# Patient Record
Sex: Female | Born: 1990 | Race: White | Hispanic: No | Marital: Married | Smoking: Never smoker
Health system: Southern US, Community
[De-identification: ages and names within clinical notes are randomized; demographics above are authoritative.]

## PROBLEM LIST (undated history)

## (undated) DIAGNOSIS — J329 Chronic sinusitis, unspecified: Secondary | ICD-10-CM

## (undated) DIAGNOSIS — J302 Other seasonal allergic rhinitis: Secondary | ICD-10-CM

---
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
## 2009-02-10 ENCOUNTER — Emergency Department (HOSPITAL_COMMUNITY): Admission: EM | Admit: 2009-02-10 | Discharge: 2009-02-11 | Payer: Self-pay | Admitting: Emergency Medicine

## 2011-04-02 IMAGING — CT CT CERVICAL SPINE W/O CM
4 of 5 series · 16 of 33 positions shown, 18 images · non-contrast
Comparison: None

CT HEAD

CLINICAL DATA: Trauma

CT HEAD WITHOUT CONTRAST
CT MAXILLOFACIAL WITHOUT CONTRAST
CT CERVICAL SPINE WITHOUT CONTRAST
TECHNIQUE: Multidetector CT imaging of the head, cervical spine,
and maxillofacial structures were performed using the standard
protocol without intravenous contrast. Multiplanar CT image
reconstructions of the cervical spine and maxillofacial structures
were also generated.

[Series 4: c_spine 2.0 b31s · axial · 0.23mm/px · z∈[-254,-170]mm · 4 of 70 slices shown, 5 images]
[im 14/70  soft-tissue]
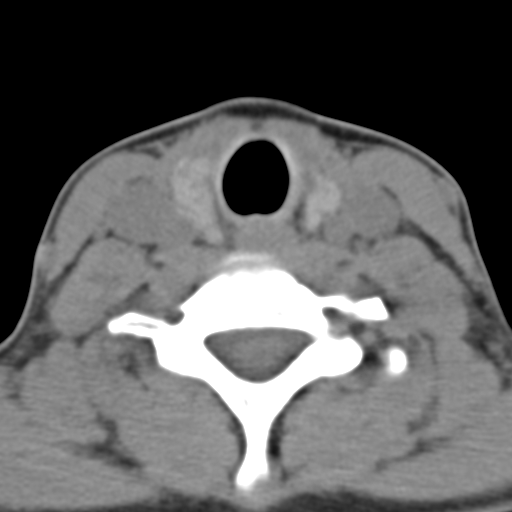
[im 14/70  bone]
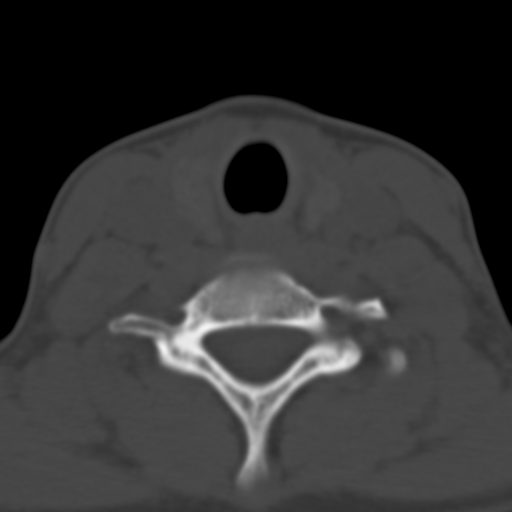
[im 28/70  bone]
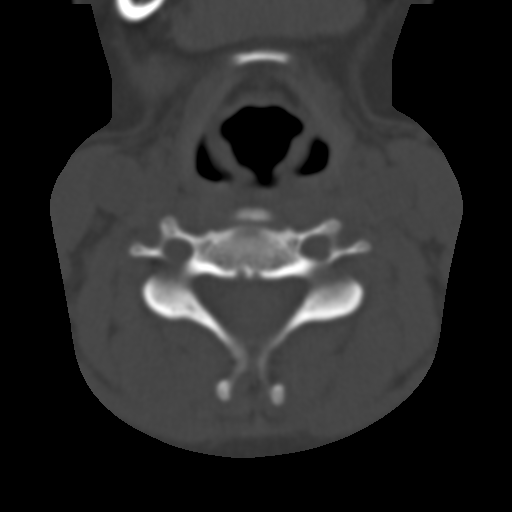
[im 42/70  bone]
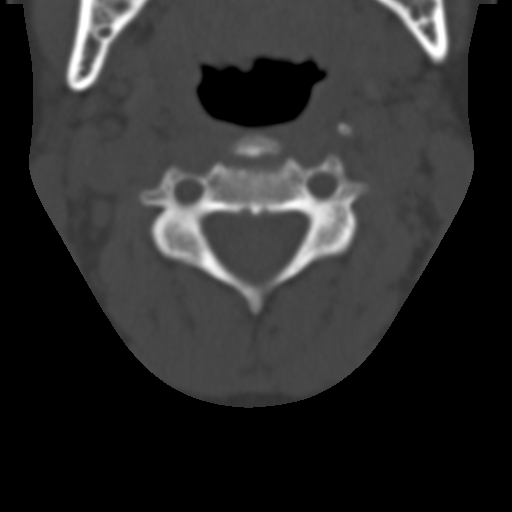
[im 56/70  bone]
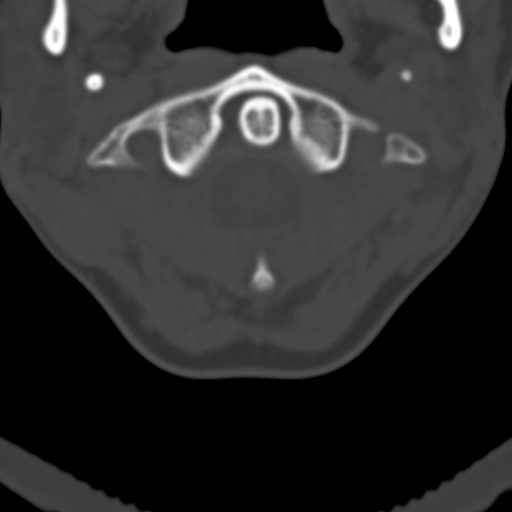

[Series 602: axial cervical · axial · 0.27mm/px · z∈[-297,-222]mm · 4 of 78 slices shown]
[im 16/78  bone]
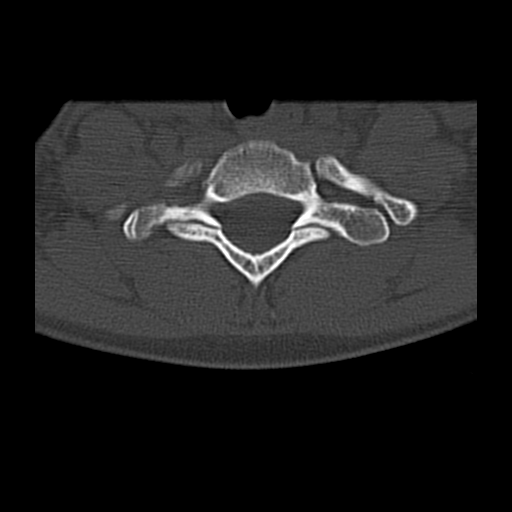
[im 31/78  bone]
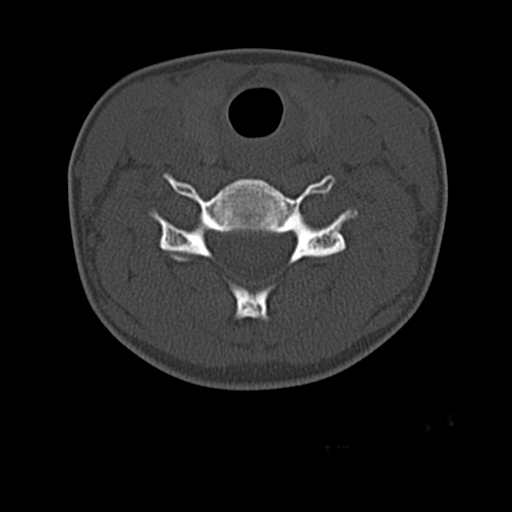
[im 47/78  bone]
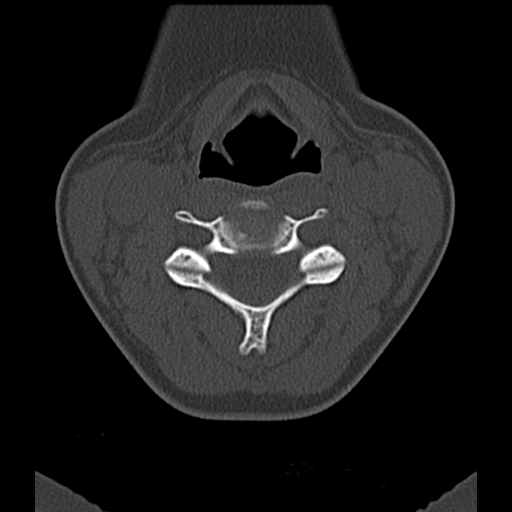
[im 62/78  bone]
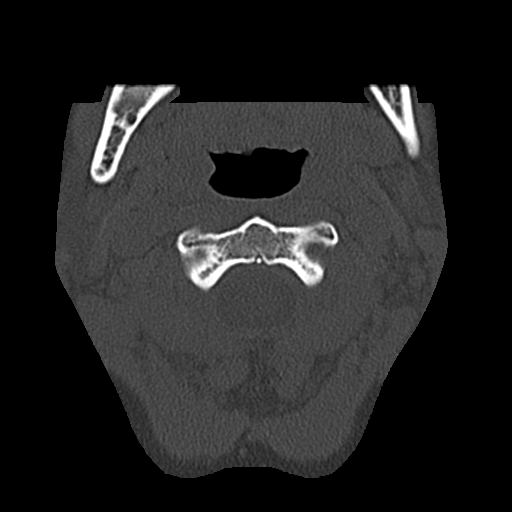

[Series 603: coronal cervical · coronal · 0.27mm/px · 3 of 28 slices shown]
[im 6/28  bone]
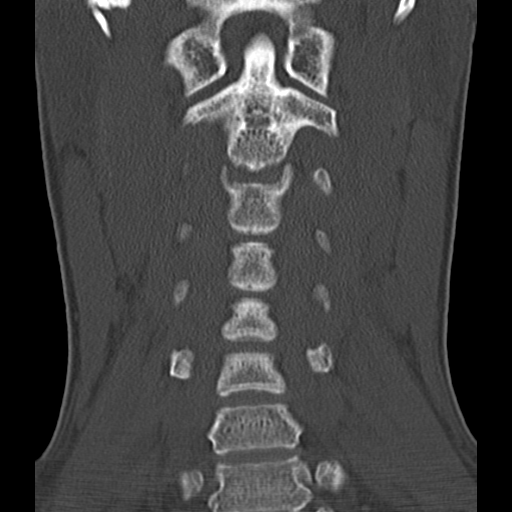
[im 11/28  bone]
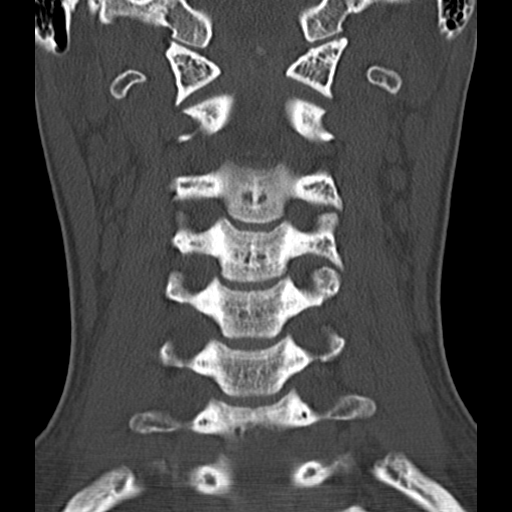
[im 17/28  bone]
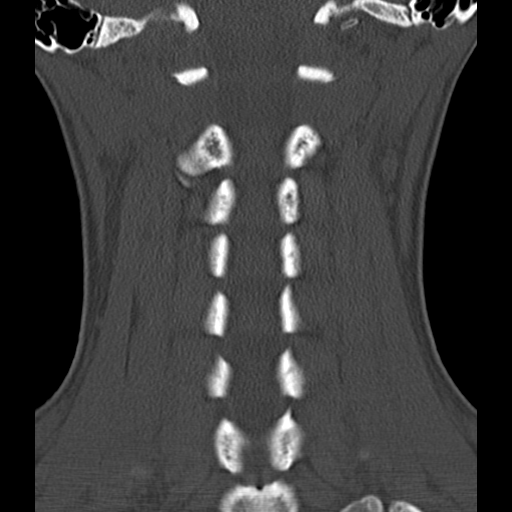

[Series 604: sagittal cervical · sagittal · 0.27mm/px · 5 of 28 slices shown, 6 images]
[im 10/28  bone]
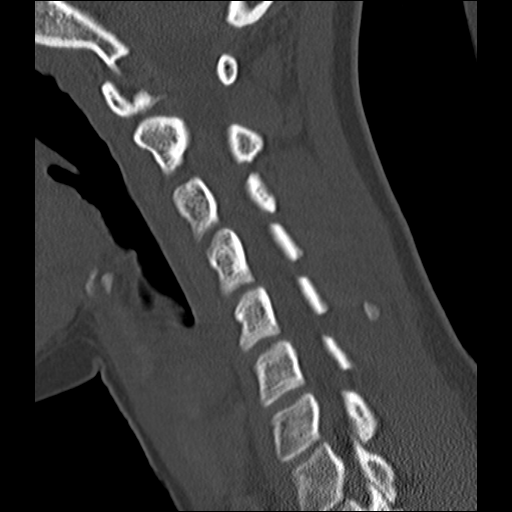
[im 12/28  bone]
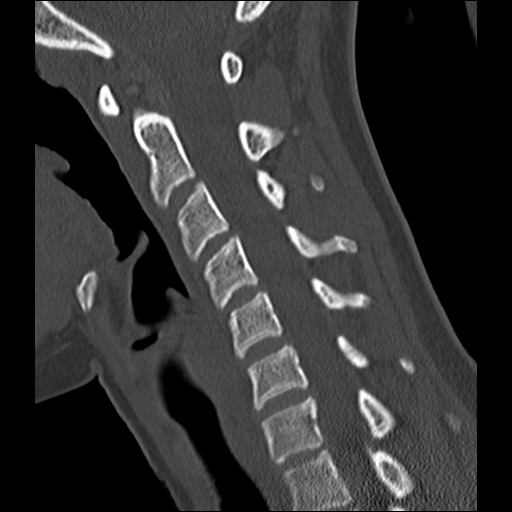
[im 14/28  soft-tissue]
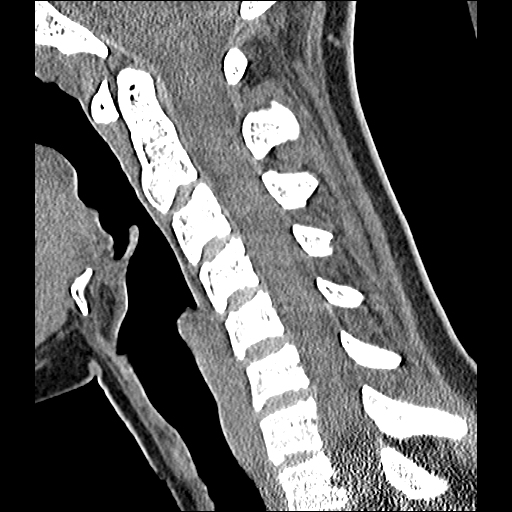
[im 14/28  bone]
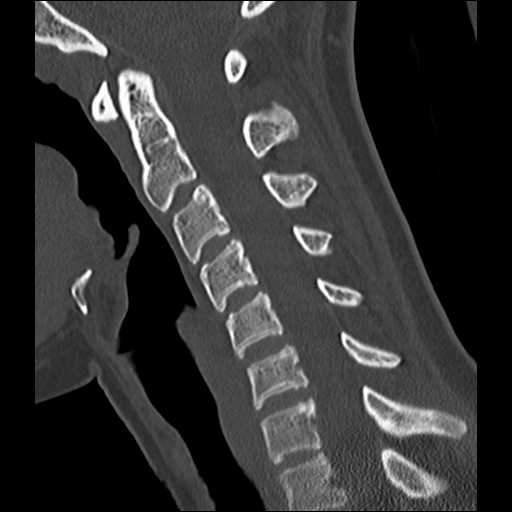
[im 16/28  bone]
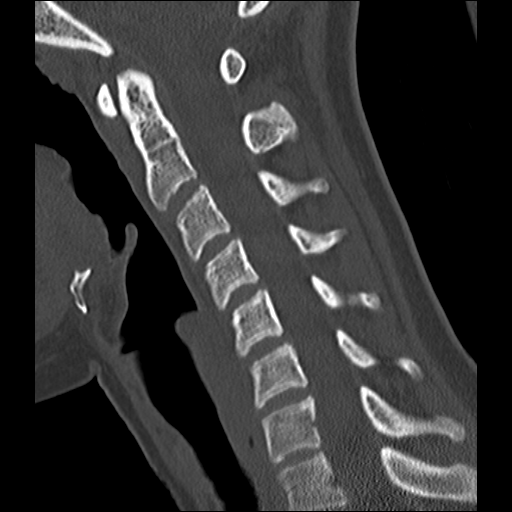
[im 19/28  bone]
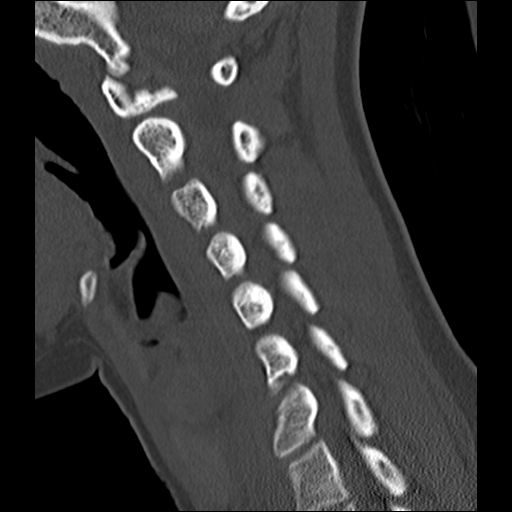

[16 of 33 positions shown; findings below may reference images not displayed]

FINDINGS: The brain has a normal appearance without evidence for
hemorrhage, infarction, hydrocephalus, or mass lesion.  There is no
extra axial fluid collection.  The skull and paranasal sinuses are
normal.
IMPRESSION: No acute intracranial abnormalities.

CT MAXILLOFACIAL
FINDINGS: The paranasal sinuses are all well aerated.  No air-
fluid levels identified.  There is no evidence for an orbital
blowout fracture.

No other focal bone abnormalities identified.  The nasal septum is
midline and the nasal bone is intact.
IMPRESSION: 1.  No evidence for the facial bone fracture.

CT CERVICAL SPINE
FINDINGS: There is no evidence of cervical spine fracture.
Alignment is normal.  Intervertebral disc spaces are maintained.
IMPRESSION: 1.  No acute findings.

## 2013-05-14 ENCOUNTER — Emergency Department
Admission: EM | Admit: 2013-05-14 | Discharge: 2013-05-14 | Disposition: A | Discharge status: Home / Self Care | Payer: 59 | Source: Home / Self Care | Attending: Family Medicine | Admitting: Family Medicine

## 2013-05-14 ENCOUNTER — Encounter: Payer: Self-pay | Admitting: Emergency Medicine

## 2013-05-14 DIAGNOSIS — J329 Chronic sinusitis, unspecified: Secondary | ICD-10-CM

## 2013-05-14 DIAGNOSIS — J0100 Acute maxillary sinusitis, unspecified: Secondary | ICD-10-CM

## 2013-05-14 DIAGNOSIS — H547 Unspecified visual loss: Secondary | ICD-10-CM

## 2013-05-14 DIAGNOSIS — J0110 Acute frontal sinusitis, unspecified: Secondary | ICD-10-CM

## 2013-05-14 HISTORY — DX: Other seasonal allergic rhinitis: J30.2

## 2013-05-14 HISTORY — DX: Chronic sinusitis, unspecified: J32.9

## 2013-05-14 LAB — POCT FASTING CBG KUC MANUAL ENTRY: POCT GLUCOSE (MANUAL ENTRY) KUC: 247 mg/dL — AB (ref 70–99)

## 2013-05-14 MED ORDER — AZITHROMYCIN 250 MG PO TABS
ORAL_TABLET | ORAL | Status: DC
Start: 2013-05-14 — End: 2013-12-05

## 2013-05-14 MED ORDER — PREDNISONE 20 MG PO TABS: 20.0000 mg | ORAL_TABLET | Freq: Two times a day (BID) | ORAL | Status: DC

## 2013-05-14 NOTE — Discharge Instructions (Signed)
Take Mucinex D (1200mg  guaifenesin with decongestant) twice daily for congestion.  Increase fluid intake, rest. May use Afrin nasal spray (or generic oxymetazoline) once daily for about 5 days.  Also recommend using saline nasal spray several times daily and saline nasal irrigation (AYR is a common brand).  Use Nasonex spray after Afrin and saline irrigation. Stop all antihistamines for now, and other non-prescription cough/cold preparations. May take Tylenol as needed for pain.    Sinusitis Sinusitis is redness, soreness, and swelling (inflammation) of the paranasal sinuses. Paranasal sinuses are air pockets within the bones of your face (beneath the eyes, the middle of the forehead, or above the eyes). In healthy paranasal sinuses, mucus is able to drain out, and air is able to circulate through them by way of your nose. However, when your paranasal sinuses are inflamed, mucus and air can become trapped. This can allow bacteria and other germs to grow and cause infection. Sinusitis can develop quickly and last only a short time (acute) or continue over a long period (chronic). Sinusitis that lasts for more than 12 weeks is considered chronic.  CAUSES  Causes of sinusitis include:  Allergies.  Structural abnormalities, such as displacement of the cartilage that separates your nostrils (deviated septum), which can decrease the air flow through your nose and sinuses and affect sinus drainage.  Functional abnormalities, such as when the small hairs (cilia) that line your sinuses and help remove mucus do not work properly or are not present. SYMPTOMS  Symptoms of acute and chronic sinusitis are the same. The primary symptoms are pain and pressure around the affected sinuses. Other symptoms include:  Upper toothache.  Earache.  Headache.  Bad breath.  Decreased sense of smell and taste.  A cough, which worsens when you are lying flat.  Fatigue.  Fever.  Thick drainage from your nose,  which often is green and may contain pus (purulent).  Swelling and warmth over the affected sinuses. DIAGNOSIS  Your caregiver will perform a physical exam. During the exam, your caregiver may:  Look in your nose for signs of abnormal growths in your nostrils (nasal polyps).  Tap over the affected sinus to check for signs of infection.  View the inside of your sinuses (endoscopy) with a special imaging device with a light attached (endoscope), which is inserted into your sinuses. If your caregiver suspects that you have chronic sinusitis, one or more of the following tests may be recommended:  Allergy tests.  Nasal culture A sample of mucus is taken from your nose and sent to a lab and screened for bacteria.  Nasal cytology A sample of mucus is taken from your nose and examined by your caregiver to determine if your sinusitis is related to an allergy. TREATMENT  Most cases of acute sinusitis are related to a viral infection and will resolve on their own within 10 days. Sometimes medicines are prescribed to help relieve symptoms (pain medicine, decongestants, nasal steroid sprays, or saline sprays).  However, for sinusitis related to a bacterial infection, your caregiver will prescribe antibiotic medicines. These are medicines that will help kill the bacteria causing the infection.  Rarely, sinusitis is caused by a fungal infection. In theses cases, your caregiver will prescribe antifungal medicine. For some cases of chronic sinusitis, surgery is needed. Generally, these are cases in which sinusitis recurs more than 3 times per year, despite other treatments. HOME CARE INSTRUCTIONS   Drink plenty of water. Water helps thin the mucus so your sinuses can drain  more easily.  Use a humidifier.  Inhale steam 3 to 4 times a day (for example, sit in the bathroom with the shower running).  Apply a warm, moist washcloth to your face 3 to 4 times a day, or as directed by your caregiver.  Use  saline nasal sprays to help moisten and clean your sinuses.  Take over-the-counter or prescription medicines for pain, discomfort, or fever only as directed by your caregiver. SEEK IMMEDIATE MEDICAL CARE IF:  You have increasing pain or severe headaches.  You have nausea, vomiting, or drowsiness.  You have swelling around your face.  You have vision problems.  You have a stiff neck.  You have difficulty breathing. MAKE SURE YOU:   Understand these instructions.  Will watch your condition.  Will get help right away if you are not doing well or get worse. Document Released: 04/28/2005 Document Revised: 07/21/2011 Document Reviewed: 05/13/2011 [facility] LPExitCare Patient Information 2014 [employer], [place].

## 2013-05-14 NOTE — ED Provider Notes (Signed)
CSN: [id number]     Arrival date & time 05/14/13  1421 History   First MD Initiated Contact with Patient 05/14/13 1515     Chief Complaint  Patient presents with  . Facial Pain      HPI Comments: Patient reports that she was treated for influenza 1.5 weeks ago with Tamiflu and improved, but has now developed facial pressure and persistent sinus congestion.  No fevers, chills, and sweats.  She has a history of seasonal allergies and sinusitis.  The history is provided by the patient.    Past Medical History  Diagnosis Date  . Recurrent sinus infections   . Seasonal allergies    History reviewed. No pertinent past surgical history. Family History  Problem Relation Age of Onset  . Hypertension Father    History  Substance Use Topics  . Smoking status: Never Smoker   . Smokeless tobacco: Not on file  . Alcohol Use: No   OB History   Grav Para Term Preterm Abortions TAB SAB Ect Mult Living                 Review of Systems No sore throat No cough No pleuritic pain No wheezing + nasal congestion + post-nasal drainage + sinus pain/pressure No itchy/red eyes No earache No hemoptysis No SOB No fever/chills No nausea No vomiting No abdominal pain No diarrhea No urinary symptoms No skin rash No fatigue No myalgias + headache Used OTC meds without relief  Allergies  Penicillins  Home Medications   Current Outpatient Rx  Name  Route  Sig  Dispense  Refill  . drospirenone-ethinyl estradiol (YAZ,GIANVI,LORYNA) 3-0.02 MG tablet   Oral   Take 1 tablet by mouth daily.         Marland Kitchen. azithromycin (ZITHROMAX Z-PAK) 250 MG tablet      Take 2 tabs today; then begin one tab once daily for 4 more days.   6 each   0   . predniSONE (DELTASONE) 20 MG tablet   Oral   Take 1 tablet (20 mg total) by mouth 2 (two) times daily. Take with food.   10 tablet   0    BP 99/67  Pulse 109  Temp(Src) 97.5 F (36.4 C) (Oral)  Resp 16  Ht 5\' 6"  (1.676 m)  Wt 125 lb (56.7 kg)   BMI 20.19 kg/m2  SpO2 97%  LMP 04/23/2013 Physical Exam Nursing notes and Vital Signs reviewed. Appearance:  Patient appears healthy, stated age, and in no acute distress Eyes:  Pupils are equal, round, and reactive to light and accomodation.  Extraocular movement is intact.  Conjunctivae are not inflamed  Ears:  Canals normal.  Tympanic membranes normal.  Nose:  Congested turbinates.   Maxillary and frontal sinus tenderness is present.  Pharynx:  Normal Neck:  Supple.  No adenopathy Lungs:  Clear to auscultation.  Breath sounds are equal.  Heart:  Regular rate and rhythm without murmurs, rubs, or gallops.  Abdomen:  Nontender without masses or hepatosplenomegaly.  Bowel sounds are present.  No CVA or flank tenderness.  Skin:  No rash present.   ED Course  Procedures  none      MDM   1. Sinusitis, chronic   2. Acute maxillary sinusitis   3. Acute frontal sinusitis    Begin prednisone burst and Z-pack. Take Mucinex D (1200mg  guaifenesin with decongestant) twice daily for congestion.  Increase fluid intake, rest. May use Afrin nasal spray (or generic oxymetazoline) once daily for about  5 days.  Also recommend using saline nasal spray several times daily and saline nasal irrigation (AYR is a common brand).  Use Nasonex spray after Afrin and saline irrigation. Stop all antihistamines for now, and other non-prescription cough/cold preparations. May take Tylenol as needed for pain. Followup with ENT if not improving    [name], MD 05/15/13 2139

## 2013-05-14 NOTE — ED Notes (Signed)
Reports being treated for influenza 1 1/2 weeks ago with Tamiflu; now feels she has a sinus infection (history of these). No OTCs today, but has tried mucinex.

## 2013-10-16 ENCOUNTER — Encounter: Payer: Self-pay | Admitting: Emergency Medicine

## 2013-10-16 ENCOUNTER — Emergency Department
Admission: EM | Admit: 2013-10-16 | Discharge: 2013-10-16 | Disposition: A | Discharge status: Home / Self Care | Payer: 59 | Source: Home / Self Care | Attending: Emergency Medicine | Admitting: Emergency Medicine

## 2013-10-16 DIAGNOSIS — J069 Acute upper respiratory infection, unspecified: Secondary | ICD-10-CM

## 2013-10-16 MED ORDER — PREDNISONE (PAK) 10 MG PO TABS: ORAL_TABLET | Freq: Every day | ORAL | Status: DC

## 2013-10-16 MED ORDER — AZITHROMYCIN 250 MG PO TABS: ORAL_TABLET | ORAL | Status: DC

## 2013-10-16 NOTE — ED Notes (Signed)
Pt complains of sinus congestion and pressure, ear fullness, sweats, chills, nasal drainage green.

## 2013-10-16 NOTE — ED Provider Notes (Signed)
CSN: [id number]     Arrival date & time 10/16/13  1134 History   First MD Initiated Contact with Patient 10/16/13 1141     Chief Complaint  Patient presents with  . Sinusitis   (Consider location/radiation/quality/duration/timing/severity/associated sxs/prior Treatment) HPI April Frost is a 23 y.o. female who complains of onset of cold symptoms for a few days.  The symptoms are constant and mild-moderate in severity.  She has a history of allergies and has seen an ENT in the past.  She gets these symptoms about every 3 months but also has chronic seasonal allergies.  Last antibiotics that she was on was about 6 months ago and at that time she also needed some prednisone. No sore throat +/- cough No pleuritic pain No wheezing + nasal congestion + post-nasal drainage + sinus pain/pressure No chest congestion No itchy/red eyes No earache No hemoptysis No SOB No chills/sweats No fever No nausea No vomiting No abdominal pain No diarrhea No skin rashes No fatigue No myalgias + headache     Past Medical History  Diagnosis Date  . Recurrent sinus infections   . Seasonal allergies    History reviewed. No pertinent past surgical history. Family History  Problem Relation Age of Onset  . Hypertension Father    History  Substance Use Topics  . Smoking status: Never Smoker   . Smokeless tobacco: Not on file  . Alcohol Use: No   OB History   Grav Para Term Preterm Abortions TAB SAB Ect Mult Living                 Review of Systems  All other systems reviewed and are negative.   Allergies  Penicillins  Home Medications   Prior to Admission medications   Medication Sig Start Date End Date Taking? Authorizing Provider  azithromycin (ZITHROMAX Z-PAK) 250 MG tablet Take 2 tabs today; then begin one tab once daily for 4 more days. 05/14/13   Lattie HawStephen A Beese, MD  azithromycin (ZITHROMAX Z-PAK) 250 MG tablet Use as directed 10/16/13   Marlaine HindJeffrey H Henderson, MD  drospirenone-ethinyl  estradiol Pierre Bali(YAZ,GIANVI,LORYNA) 3-0.02 MG tablet Take 1 tablet by mouth daily.    Historical Provider, MD  predniSONE (DELTASONE) 20 MG tablet Take 1 tablet (20 mg total) by mouth 2 (two) times daily. Take with food. 05/14/13   Lattie HawStephen A Beese, MD  predniSONE (STERAPRED UNI-PAK) 10 MG tablet Take by mouth daily. 6 day pack, use as directed 10/16/13   Marlaine HindJeffrey H Henderson, MD   BP 105/72  Pulse 127  Temp(Src) 97.9 F (36.6 C) (Oral)  Ht 5\' 6"  (1.676 m)  Wt 120 lb 8 oz (54.658 kg)  BMI 19.46 kg/m2  SpO2 97% Physical Exam  Nursing note and vitals reviewed. Constitutional: She is oriented to person, place, and time. She appears well-developed and well-nourished.  HENT:  Head: Normocephalic and atraumatic.  Right Ear: Tympanic membrane, external ear and ear canal normal.  Left Ear: Tympanic membrane, external ear and ear canal normal.  Nose: Mucosal edema and rhinorrhea present. Right sinus exhibits maxillary sinus tenderness. Left sinus exhibits maxillary sinus tenderness.  Mouth/Throat: No oropharyngeal exudate, posterior oropharyngeal edema or posterior oropharyngeal erythema.  Eyes: No scleral icterus.  Neck: Neck supple.  Cardiovascular: Regular rhythm and normal heart sounds.   Pulmonary/Chest: Effort normal and breath sounds normal. No respiratory distress.  Neurological: She is alert and oriented to person, place, and time.  Skin: Skin is warm and dry.  Psychiatric: She has a normal mood  and affect. Her speech is normal.    ED Course  Procedures (including critical care time) Labs Review Labs Reviewed - No data to display  Imaging Review No results found.   MDM   1. Acute upper respiratory infections of unspecified site    1)  Take the prescribed antibiotic as instructed.  Take the prednisone first.  Educated her on allergies and treatment.   2)  Use nasal saline solution (over the counter) at least 3 times a day. 3)  Use over the counter decongestants like Zyrtec-D every 12  hours as needed to help with congestion.  If you have hypertension, do not take medicines with sudafed.  4)  Can take tylenol every 6 hours or motrin every 8 hours for pain or fever. 5)  Follow up with your primary doctor if no improvement in 5-7 days, sooner if increasing pain, fever, or new symptoms.     Marlaine Hind, MD 10/16/13 [phone]

## 2013-12-05 ENCOUNTER — Encounter: Payer: Self-pay | Admitting: Physician Assistant

## 2013-12-05 ENCOUNTER — Ambulatory Visit (INDEPENDENT_AMBULATORY_CARE_PROVIDER_SITE_OTHER): Payer: 59 | Admitting: Physician Assistant

## 2013-12-05 VITALS — BP 130/86 | HR 77 | Ht 66.0 in | Wt 123.0 lb

## 2013-12-05 DIAGNOSIS — R55 Syncope and collapse: Secondary | ICD-10-CM

## 2013-12-05 DIAGNOSIS — Z13220 Encounter for screening for lipoid disorders: Secondary | ICD-10-CM

## 2013-12-05 DIAGNOSIS — Z131 Encounter for screening for diabetes mellitus: Secondary | ICD-10-CM

## 2013-12-06 NOTE — Progress Notes (Signed)
Subjective:    Patient ID: April Frost, female    DOB: 11/30/1990, 23 y.o.   MRN: [id number]  HPI Patient is a 23 year old female who presents to the clinic to establish care.  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 She reports that this problem has been going On when she was a very young child. She has had workups of blood And also EKG as well but have always been normal. She's never been sent to a specialist. She has never had any imaging. Her last episode of passing out was November 2014 wash your struggling with a virus. Currently nothing is being done about her passing out. She feels when she is going to pass out and gets to the ground. Never hit her head. She comes back to in seconds.   .. Family History  Problem Relation Age of Onset  . Hypertension Father   . Hyperlipidemia Father   . Heart attack Paternal Grandfather    .Marland Kitchen. History   Social History  . Marital Status: Single    Spouse Name: N/A    Number of Children: N/A  . Years of Education: N/A   Occupational History  . Not on file.   Social History Main Topics  . Smoking status: Never Smoker   . Smokeless tobacco: Not on file  . Alcohol Use: No  . Drug Use: No  . Sexual Activity: Yes   Other Topics Concern  . Not on file   Social History Narrative  . No narrative on file   Patient is currently on birth control and her last Pap smear was 01/2013 that her OB/GYN. Her OB/GYN didn't prescribe her birth control.  She would like to make sure she is overall healthy today and establish care. Last winter she was in urgent care a lot do to virus and bacterial infections. 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 She wants to know of any recommendations for how to get immune system up.   Review of Systems  All other systems reviewed and are negative.      Objective:   Physical Exam  Constitutional: She is oriented to person, place, and  time. She appears well-developed and well-nourished.  HENT:  Head: Normocephalic and atraumatic.  Neck: Normal range of motion. Neck supple. No thyromegaly present.  Cardiovascular: Normal rate, regular rhythm and normal heart sounds.   Pulmonary/Chest: Effort normal and breath sounds normal.  Neurological: She is alert and oriented to person, place, and time.  Skin: Skin is dry.  Psychiatric: She has a normal mood and affect. Her behavior is normal.          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 It does sound like when she has passing out episodes could be do to dehydration, vasovagal response or when she is sick. I will reviewed records when I get them. Suggested that pt think about referral to neurology to further evaluation. She will consider but does not seemed bothered with it at this time.   We did discuss ways to keep immune system healthy. Handwashing as well as vitamin C and think were encouraged. Also encouraged good sleep of at least 8 hours at night. Pt aware there are no studies about essential oil but cannot hurt to use based on symptoms. Start mucinex early when feeling congested.  She did see ENT for her multiple cases of strep  throat that they did not want to take her tonsils so she had more episodes. Come see [place] when not feel well.   Patient is unaware of her last tetanus shot. She would like to wait to get records from her pediatrics office before we give tetanus shot. She does feel like she had it going into college.  OB/GYN manages her birth control. Encouraged her to have STD testing if sexually active especially using condoms. Encouraged use of condoms.

## 2014-09-18 ENCOUNTER — Emergency Department
Admission: EM | Admit: 2014-09-18 | Discharge: 2014-09-18 | Disposition: A | Discharge status: Home / Self Care | Payer: BLUE CROSS/BLUE SHIELD | Source: Home / Self Care | Attending: Family Medicine | Admitting: Family Medicine

## 2014-09-18 ENCOUNTER — Ambulatory Visit: Payer: Self-pay | Admitting: Physician Assistant

## 2014-09-18 ENCOUNTER — Encounter: Payer: Self-pay | Admitting: Emergency Medicine

## 2014-09-18 DIAGNOSIS — J309 Allergic rhinitis, unspecified: Secondary | ICD-10-CM

## 2014-09-18 DIAGNOSIS — J3089 Other allergic rhinitis: Secondary | ICD-10-CM

## 2014-09-18 DIAGNOSIS — J069 Acute upper respiratory infection, unspecified: Secondary | ICD-10-CM | POA: Diagnosis not present

## 2014-09-18 MED ORDER — BECLOMETHASONE DIPROPIONATE 80 MCG/ACT NA AERS: INHALATION_SPRAY | NASAL | Status: DC

## 2014-09-18 MED ORDER — AZITHROMYCIN 250 MG PO TABS: ORAL_TABLET | ORAL | Status: DC

## 2014-09-18 MED ORDER — BENZONATATE 200 MG PO CAPS: 200.0000 mg | ORAL_CAPSULE | Freq: Every day | ORAL | Status: DC

## 2014-09-18 MED ORDER — PREDNISONE 20 MG PO TABS: 20.0000 mg | ORAL_TABLET | Freq: Two times a day (BID) | ORAL | Status: DC

## 2014-09-18 NOTE — Discharge Instructions (Signed)
Continue Mucinex D twice daily, with plenty of water, for cough and congestion.    May use Afrin nasal spray (or generic oxymetazoline) once daily for about 5 days.  Also recommend using saline nasal spray several times daily and saline nasal irrigation (AYR is a common brand).  Use Qnasl nasal spray each morning after using Afrin nasal spray and saline nasal irrigation.  Attempt to taper off Afrin spray. Try warm salt water gargles for sore throat.  Stop all antihistamines for now, and other non-prescription cough/cold preparations. Begin benzonatate (Tessalon) at bedtime if cough develops   Follow-up with family doctor if not improving about10 days.

## 2014-09-18 NOTE — ED Notes (Signed)
Reports congestion x 3 days; thought it was seasonal allergies but has not resumed her Allegra.

## 2014-09-18 NOTE — ED Provider Notes (Signed)
CSN: [id number]     Arrival date & time 09/18/14  1610 History   First MD Initiated Contact with Patient 09/18/14 1648     Chief Complaint  Patient presents with  . Nasal Congestion      HPI Comments: Patient complains of two day history of typical cold-like symptoms including mild sore throat, increased sinus congestion, fatigue, and chills.  No cough.  She has seasonal allergies and thought that she was having a flare-up.  Past history of asthma as a child.   The history is provided by the patient.    Past Medical History  Diagnosis Date  . Recurrent sinus infections   . Seasonal allergies    History reviewed. No pertinent past surgical history. Family History  Problem Relation Age of Onset  . Hypertension Father   . Hyperlipidemia Father   . Heart attack Paternal Grandfather    History  Substance Use Topics  . Smoking status: Never Smoker   . Smokeless tobacco: Not on file  . Alcohol Use: No   OB History    No data available     Review of Systems + sore throat No cough No pleuritic pain No wheezing + nasal congestion + post-nasal drainage + sinus pain/pressure No itchy/red eyes No earache No hemoptysis No SOB No fever, + chills + nausea No vomiting No abdominal pain No diarrhea No urinary symptoms No skin rash + fatigue No myalgias No headache Used OTC meds without relief  Allergies  Penicillins  Home Medications   Prior to Admission medications   Medication Sig Start Date End Date Taking? Authorizing Provider  azithromycin (ZITHROMAX Z-PAK) 250 MG tablet Take 2 tabs today; then begin one tab once daily for 4 more days. 09/18/14   Lattie HawStephen A Beese, MD  Beclomethasone Dipropionate (QNASL) 80 MCG/ACT AERS Place 2 sprays in each nostril once daily 09/18/14   Lattie HawStephen A Beese, MD  benzonatate (TESSALON) 200 MG capsule Take 1 capsule (200 mg total) by mouth at bedtime. Take as needed for cough 09/18/14   Lattie HawStephen A Beese, MD  drospirenone-ethinyl estradiol  (YAZ,GIANVI,LORYNA) 3-0.02 MG tablet Take 1 tablet by mouth daily.    Historical Provider, MD  predniSONE (DELTASONE) 20 MG tablet Take 1 tablet (20 mg total) by mouth 2 (two) times daily. Take with food. 09/18/14   Lattie HawStephen A Beese, MD   BP 108/71 mmHg  Pulse 86  Temp(Src) 98 F (36.7 C) (Oral)  Resp 16  Ht 5\' 6"  (1.676 m)  Wt 125 lb (56.7 kg)  BMI 20.19 kg/m2  SpO2 100%  LMP 09/07/2014 Physical Exam Nursing notes and Vital Signs reviewed. Appearance:  Patient appears stated age, and in no acute distress Eyes:  Pupils are equal, round, and reactive to light and accomodation.  Extraocular movement is intact.  Conjunctivae are not inflamed  Ears:  Canals normal.  Tympanic membranes normal.  Nose: Congested turbinates.  Maxillary sinus tenderness is present.  Pharynx:  Normal Neck:  Supple.   Nontender posterior nodes are palpated bilaterally  Lungs:  Clear to auscultation.  Breath sounds are equal.  Heart:  Regular rate and rhythm without murmurs, rubs, or gallops.  Abdomen:  Nontender without masses or hepatosplenomegaly.  Bowel sounds are present.  No CVA or flank tenderness.  Extremities:  No edema.  No calf tenderness Skin:  No rash present.   ED Course  Procedures  none   MDM   1. Viral URI; early symptoms   2. Perennial allergic rhinitis  Begin trial of Qnasl nasal spray.  Begin prednisone burst and Z-pack Continue Mucinex D twice daily, with plenty of water, for cough and congestion.    May use Afrin nasal spray (or generic oxymetazoline) once daily for about 5 days.  Also recommend using saline nasal spray several times daily and saline nasal irrigation (AYR is a common brand).  Use Qnasl nasal spray each morning after using Afrin nasal spray and saline nasal irrigation.  Attempt to taper off Afrin spray. Try warm salt water gargles for sore throat.  Stop all antihistamines for now, and other non-prescription cough/cold preparations. Begin benzonatate (Tessalon) at  bedtime if cough develops   Follow-up with family doctor if not improving about10 days.     Lattie HawStephen A Beese, MD 09/29/14 [phone]

## 2015-05-03 ENCOUNTER — Emergency Department (INDEPENDENT_AMBULATORY_CARE_PROVIDER_SITE_OTHER)
Admission: EM | Admit: 2015-05-03 | Discharge: 2015-05-03 | Disposition: A | Discharge status: Home / Self Care | Payer: BLUE CROSS/BLUE SHIELD | Source: Home / Self Care | Attending: Family Medicine | Admitting: Family Medicine

## 2015-05-03 DIAGNOSIS — R05 Cough: Secondary | ICD-10-CM

## 2015-05-03 DIAGNOSIS — R059 Cough, unspecified: Secondary | ICD-10-CM

## 2015-05-03 DIAGNOSIS — J029 Acute pharyngitis, unspecified: Secondary | ICD-10-CM

## 2015-05-03 LAB — POCT RAPID STREP A (OFFICE): Rapid Strep A Screen: NEGATIVE

## 2015-05-03 MED ORDER — BENZONATATE 100 MG PO CAPS: 100.0000 mg | ORAL_CAPSULE | Freq: Three times a day (TID) | ORAL | Status: DC

## 2015-05-03 MED ORDER — MAGIC MOUTHWASH W/LIDOCAINE: 5.0000 mL | Freq: Three times a day (TID) | ORAL | Status: DC | PRN

## 2015-05-03 NOTE — ED Provider Notes (Signed)
CSN: [id number]     Arrival date & time 05/03/15  1629 History   First MD Initiated Contact with Patient 05/03/15 1649     Chief Complaint  Patient presents with  . Sore Throat   (Consider location/radiation/quality/duration/timing/severity/associated sxs/prior Treatment) HPI Pt is a 24yo female presenting to [facility] with c/o gradually worsening sore throat with chills that started 3 days ago, associated rhinorrhea and congestion, bilateral ear "fullness" but denies ear pain or frontal headache. Reports mild intermittent non-productive cough. 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 Denies n/v/d. Denies difficulty breathing or swallowing.   Past Medical History  Diagnosis Date  . Recurrent sinus infections   . Seasonal allergies    History reviewed. No pertinent past surgical history. Family History  Problem Relation Age of Onset  . Hypertension Father   . Hyperlipidemia Father   . Heart attack Paternal Grandfather    Social History  Substance Use Topics  . Smoking status: Never Smoker   . Smokeless tobacco: None  . Alcohol Use: No   OB History    No data available     Review of Systems  Constitutional: Positive for fever and chills.  HENT: Positive for congestion, ear pain ( bilateral "fullness"), rhinorrhea and sore throat. Negative for trouble swallowing and voice change.   Respiratory: Positive for cough. Negative for shortness of breath.   Cardiovascular: Negative for chest pain and palpitations.  Gastrointestinal: Negative for nausea, vomiting, abdominal pain and diarrhea.  Musculoskeletal: Negative for myalgias, back pain and arthralgias.  Skin: Negative for rash.  Neurological: Negative for dizziness, light-headedness and headaches.    Allergies  Penicillins  Home Medications   Prior to Admission medications   Medication Sig Start Date End Date Taking? Authorizing Provider  Beclomethasone Dipropionate (QNASL) 80 MCG/ACT AERS Place 2  sprays in each nostril once daily 09/18/14   [name], MD  benzonatate (TESSALON) 100 MG capsule Take 1 capsule (100 mg total) by mouth every 8 (eight) hours. 05/03/15   Junius Finner, PA-C  benzonatate (TESSALON) 200 MG capsule Take 1 capsule (200 mg total) by mouth at bedtime. Take as needed for cough 09/18/14   [name], MD  drospirenone-ethinyl estradiol (YAZ,GIANVI,LORYNA) 3-0.02 MG tablet Take 1 tablet by mouth daily.    Historical Provider, MD  magic mouthwash w/lidocaine SOLN Take 5 mLs by mouth 3 (three) times daily as needed for mouth pain. Swish, gargle, and spit. May swallow small amounts 05/03/15   Junius Finner, PA-C   Meds Ordered and Administered this Visit  Medications - No data to display  BP 124/83 mmHg  Pulse 102  Temp(Src) 98.1 F (36.7 C) (Oral)  Ht  (1.676 m)  Wt 128 lb (58.06 kg)  BMI 20.67 kg/m2  SpO2 100%  LMP 04/21/2015 (Approximate) No data found.   Physical Exam  Constitutional: She appears well-developed and well-nourished. No distress.  HENT:  Head: Normocephalic and atraumatic.  Right Ear: Hearing, tympanic membrane, external ear and ear canal normal.  Left Ear: Hearing, tympanic membrane, external ear and ear canal normal.  Nose: Nose normal. Right sinus exhibits no maxillary sinus tenderness and no frontal sinus tenderness. Left sinus exhibits no maxillary sinus tenderness and no frontal sinus tenderness.  Mouth/Throat: Uvula is midline and mucous membranes are normal. Posterior oropharyngeal erythema present. No oropharyngeal exudate, posterior oropharyngeal edema or tonsillar abscesses.  Eyes: Conjunctivae are normal. No scleral icterus.  Neck: Normal range of motion. Neck supple.  Cardiovascular: Normal rate, regular rhythm and normal heart sounds.   Pulmonary/Chest: Effort normal and breath sounds normal. No stridor. No respiratory distress. She has no wheezes. She has no rales. She exhibits no tenderness.  Abdominal: Soft. She  exhibits no distension and no mass. There is no tenderness. There is no rebound and no guarding.  Musculoskeletal: Normal range of motion.  Lymphadenopathy:    She has no cervical adenopathy.  Neurological: She is alert.  Skin: Skin is warm and dry. She is not diaphoretic.  Nursing note and vitals reviewed.   ED Course  Procedures (including critical care time)  Labs Review Labs Reviewed  STREP A DNA PROBE  POCT RAPID STREP A (OFFICE)    Imaging Review No results found.     MDM   1. Sore throat   2. 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 Nasal congestion and mild cough also present. No evidence of peritonsillar abscess.  Rapid strep: negative, culture sent.  Reassured pt symptoms likely viral in nature. Encouraged symptomatic treatment. Pt requested pain medication for sore throat.   Rx: magic mouthwash (swish, gargle spit TID PRN), and tessalon Advised pt to use acetaminophen and ibuprofen as needed for fever and pain. Encouraged rest and fluids. F/u with PCP in 5-7 days if not improving, sooner if worsening. Pt verbalized understanding and agreement with tx plan.   Junius Finnerrin O'Malley, PA-C 05/03/15 1725

## 2015-05-03 NOTE — ED Notes (Signed)
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  This morning worse

## 2015-05-03 NOTE — Discharge Instructions (Signed)

## 2015-05-04 LAB — STREP A DNA PROBE: GASP: NOT DETECTED

## 2015-05-08 ENCOUNTER — Telehealth: Payer: Self-pay | Admitting: Emergency Medicine

## 2015-05-21 ENCOUNTER — Emergency Department (INDEPENDENT_AMBULATORY_CARE_PROVIDER_SITE_OTHER)
Admission: EM | Admit: 2015-05-21 | Discharge: 2015-05-21 | Disposition: A | Discharge status: Home / Self Care | Payer: BLUE CROSS/BLUE SHIELD | Source: Home / Self Care | Attending: Family Medicine | Admitting: Family Medicine

## 2015-05-21 ENCOUNTER — Encounter: Payer: Self-pay | Admitting: Emergency Medicine

## 2015-05-21 DIAGNOSIS — J019 Acute sinusitis, unspecified: Secondary | ICD-10-CM

## 2015-05-21 MED ORDER — DOXYCYCLINE HYCLATE 100 MG PO CAPS: 100.0000 mg | ORAL_CAPSULE | Freq: Two times a day (BID) | ORAL | Status: DC

## 2015-05-21 NOTE — Discharge Instructions (Signed)
You may take 400-600mg Ibuprofen (Motrin) every 6-8 hours for fever and pain  °Alternate with Tylenol  °You may take 500mg Tylenol every 4-6 hours as needed for fever and pain  °Follow-up with your primary care provider next week for recheck of symptoms if not improving.  °Be sure to drink plenty of fluids and rest, at least 8hrs of sleep a night, preferably more while you are sick. °Return urgent care or go to closest ER if you cannot keep down fluids/signs of dehydration, fever not reducing with Tylenol, difficulty breathing/wheezing, stiff neck, worsening condition, or other concerns (see below)  °Please take antibiotics as prescribed and be sure to complete entire course even if you start to feel better to ensure infection does not come back. ° ° °Sinusitis, Adult °Sinusitis is redness, soreness, and inflammation of the paranasal sinuses. Paranasal sinuses are air pockets within the bones of your face. They are located beneath your eyes, in the middle of your forehead, and above your eyes. In healthy paranasal sinuses, mucus is able to drain out, and air is able to circulate through them by way of your nose. However, when your paranasal sinuses are inflamed, mucus and air can become trapped. This can allow bacteria and other germs to grow and cause infection. °Sinusitis can develop quickly and last only a short time (acute) or continue over a long period (chronic). Sinusitis that lasts for more than 12 weeks is considered chronic. °CAUSES °Causes of sinusitis include: °· Allergies. °· Structural abnormalities, such as displacement of the cartilage that separates your nostrils (deviated septum), which can decrease the air flow through your nose and sinuses and affect sinus drainage. °· Functional abnormalities, such as when the small hairs (cilia) that line your sinuses and help remove mucus do not work properly or are not present. °SIGNS AND SYMPTOMS °Symptoms of acute and chronic sinusitis are the same. The  primary symptoms are pain and pressure around the affected sinuses. Other symptoms include: °· Upper toothache. °· Earache. °· Headache. °· Bad breath. °· Decreased sense of smell and taste. °· A cough, which worsens when you are lying flat. °· Fatigue. °· Fever. °· Thick drainage from your nose, which often is green and may contain pus (purulent). °· Swelling and warmth over the affected sinuses. °DIAGNOSIS °Your health care provider will perform a physical exam. During your exam, your health care provider may perform any of the following to help determine if you have acute sinusitis or chronic sinusitis: °· Look in your nose for signs of abnormal growths in your nostrils (nasal polyps). °· Tap over the affected sinus to check for signs of infection. °· View the inside of your sinuses using an imaging device that has a light attached (endoscope). °If your health care provider suspects that you have chronic sinusitis, one or more of the following tests may be recommended: °· Allergy tests. °· Nasal culture. A sample of mucus is taken from your nose, sent to a lab, and screened for bacteria. °· Nasal cytology. A sample of mucus is taken from your nose and examined by your health care provider to determine if your sinusitis is related to an allergy. °TREATMENT °Most cases of acute sinusitis are related to a viral infection and will resolve on their own within 10 days. Sometimes, medicines are prescribed to help relieve symptoms of both acute and chronic sinusitis. These may include pain medicines, decongestants, nasal steroid sprays, or saline sprays. °However, for sinusitis related to a bacterial infection, your health   care provider will prescribe antibiotic medicines. These are medicines that will help kill the bacteria causing the infection. °Rarely, sinusitis is caused by a fungal infection. In these cases, your health care provider will prescribe antifungal medicine. °For some cases of chronic sinusitis, surgery  is needed. Generally, these are cases in which sinusitis recurs more than 3 times per year, despite other treatments. °HOME CARE INSTRUCTIONS °· Drink plenty of water. Water helps thin the mucus so your sinuses can drain more easily. °· Use a humidifier. °· Inhale steam 3-4 times a day (for example, sit in the bathroom with the shower running). °· Apply a warm, moist washcloth to your face 3-4 times a day, or as directed by your health care provider. °· Use saline nasal sprays to help moisten and clean your sinuses. °· Take medicines only as directed by your health care provider. °· If you were prescribed either an antibiotic or antifungal medicine, finish it all even if you start to feel better. °SEEK IMMEDIATE MEDICAL CARE IF: °· You have increasing pain or severe headaches. °· You have nausea, vomiting, or drowsiness. °· You have swelling around your face. °· You have vision problems. °· You have a stiff neck. °· You have difficulty breathing. °  °This information is not intended to replace advice given to you by your health care provider. Make sure you discuss any questions you have with your health care provider. °  °Document Released: 04/28/2005 Document Revised: 05/19/2014 Document Reviewed: 05/13/2011 °Elsevier Interactive Patient Education ©2016 Elsevier Inc. ° °Sinus Rinse °WHAT IS A SINUS RINSE? °A sinus rinse is a home treatment. It rinses your sinuses with a mixture of salt and water (saline solution). Sinuses are air-filled spaces in your skull behind the bones of your face and forehead. They open into your nasal cavity. °To do a sinus rinse, you will need: °· Saline solution. °· Neti pot or spray bottle. This releases the saline solution into your nose and through your sinuses. You can buy neti pots and spray bottles at: °¨ Your local pharmacy. °¨ A health food store. °¨ Online. °WHEN WOULD I DO A SINUS RINSE?  °A sinus rinse can help to clear your nasal cavity. It can clear:   °· Mucus. °· Dirt. °· Dust. °· Pollen. °You may do a sinus rinse when you have: °· A cold. °· A virus. °· Allergies. °· A sinus infection. °· A stuffy nose. °If you are considering a sinus rinse: °· Ask your child's doctor before doing a sinus rinse on your child. °· Do not do a sinus rinse if you have had: °¨ Ear or nasal surgery. °¨ An ear infection. °¨ Blocked ears. °HOW DO I DO A SINUS RINSE?  °· Wash your hands. °· Disinfect your device using the directions that came with the device. °· Dry your device. °· Use the solution that comes with your device or one that is sold separately in stores. Follow the mixing directions on the package. °· Fill your device with the amount of saline solution as stated in the device instructions. °· Stand over a sink and tilt your head sideways over the sink. °· Place the spout of the device in your upper nostril (the one closer to the ceiling). °· Gently pour or squeeze the saline solution into the nasal cavity. The liquid should drain to the lower nostril if you are not too congested. °· Gently blow your nose. Blowing too hard may cause ear pain. °· Repeat in the other   nostril. °· Clean and rinse your device with clean water. °· Air-dry your device. °ARE THERE RISKS OF A SINUS RINSE?  °Sinus rinse is normally very safe and helpful. However, there are a few risks, which include:  °· A burning feeling in the sinuses. This may happen if you do not make the saline solution as instructed. Make sure to follow all directions when making the saline solution. °· Infection from unclean water. This is rare, but possible. °· Nasal irritation. °  °This information is not intended to replace advice given to you by your health care provider. Make sure you discuss any questions you have with your health care provider. °  °Document Released: 11/23/2013 Document Reviewed: 11/23/2013 °Elsevier Interactive Patient Education ©2016 Elsevier Inc. ° ° °

## 2015-05-21 NOTE — ED Notes (Signed)
2 weeks, sinus pain, pressure, congestion, worse this weekend.

## 2015-05-21 NOTE — ED Provider Notes (Signed)
CSN: [id number]     Arrival date & time 05/21/15  1040 History   First MD Initiated Contact with Patient 05/21/15 1056     Chief Complaint  Patient presents with  . Sinus Problem   (Consider location/radiation/quality/duration/timing/severity/associated sxs/prior Treatment) HPI Pt is a 25yo female presenting to [facility] with c/o worsening sinus congestion and pressure for 2 weeks.  Pt was seen on 05/03/15 for a sore throat. Pain did improve but then sinus congestion and pressure started around Christmas.  Pain and pressure were worse this weekend.  Only minimal relief from ibuprofen of frontal headache that is 4/10 at this time. Denies fever, chills, n/v/d. 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   Past Medical History  Diagnosis Date  . Recurrent sinus infections   . Seasonal allergies    History reviewed. No pertinent past surgical history. Family History  Problem Relation Age of Onset  . Hypertension Father   . Hyperlipidemia Father   . Heart attack Paternal Grandfather    Social History  Substance Use Topics  . Smoking status: Never Smoker   . Smokeless tobacco: None  . Alcohol Use: No   OB History    No data available     Review of Systems  Constitutional: Negative for fever and chills.  HENT: Positive for congestion, nosebleeds, rhinorrhea and sinus pressure. Negative for ear pain, sore throat, trouble swallowing and voice change.   Respiratory: Negative for cough and shortness of breath.   Cardiovascular: Negative for chest pain and palpitations.  Gastrointestinal: Negative for nausea, vomiting, abdominal pain and diarrhea.  Musculoskeletal: Negative for myalgias, back pain and arthralgias.  Skin: Negative for rash.    Allergies  Penicillins  Home Medications   Prior to Admission medications   Medication Sig Start Date End Date Taking? Authorizing Provider  Beclomethasone Dipropionate (QNASL) 80 MCG/ACT AERS Place 2 sprays in each nostril once daily  09/18/14   [name], MD  benzonatate (TESSALON) 100 MG capsule Take 1 capsule (100 mg total) by mouth every 8 (eight) hours. 05/03/15   Junius Finner, PA-C  benzonatate (TESSALON) 200 MG capsule Take 1 capsule (200 mg total) by mouth at bedtime. Take as needed for cough 09/18/14   [name], MD  doxycycline (VIBRAMYCIN) 100 MG capsule Take 1 capsule (100 mg total) by mouth 2 (two) times daily. One po bid x 7 days 05/21/15   Junius Finner, PA-C  drospirenone-ethinyl estradiol (YAZ,GIANVI,LORYNA) 3-0.02 MG tablet Take 1 tablet by mouth daily.    Historical Provider, MD  magic mouthwash w/lidocaine SOLN Take 5 mLs by mouth 3 (three) times daily as needed for mouth pain. Swish, gargle, and spit. May swallow small amounts 05/03/15   Junius Finner, PA-C   Meds Ordered and Administered this Visit  Medications - No data to display  BP 114/74 mmHg  Pulse 92  Temp(Src) 97.7 F (36.5 C) (Oral)  Ht 5\' 6"  (1.676 m)  Wt 127 lb (57.607 kg)  BMI 20.51 kg/m2  SpO2 100%  LMP 05/16/2014 No data found.   Physical Exam  Constitutional: She appears well-developed and well-nourished. No distress.  HENT:  Head: Normocephalic and atraumatic.  Right Ear: Hearing, tympanic membrane, external ear and ear canal normal.  Left Ear: Hearing, tympanic membrane, external ear and ear canal normal.  Nose: Mucosal edema present. Right sinus exhibits maxillary sinus tenderness and frontal sinus tenderness. Left sinus exhibits maxillary sinus tenderness. Left sinus exhibits no frontal sinus tenderness.  Mouth/Throat: Uvula  is midline, oropharynx is clear and moist and mucous membranes are normal.  Eyes: Conjunctivae are normal. No scleral icterus.  Neck: Normal range of motion. Neck supple.  Cardiovascular: Normal rate, regular rhythm and normal heart sounds.   Pulmonary/Chest: Effort normal and breath sounds normal. No stridor. No respiratory distress. She has no wheezes. She has no rales. She exhibits no  tenderness.  Abdominal: Soft. She exhibits no distension. There is no tenderness.  Musculoskeletal: Normal range of motion.  Lymphadenopathy:    She has no cervical adenopathy.  Neurological: She is alert.  Skin: Skin is warm and dry. She is not diaphoretic.  Nursing note and vitals reviewed.   ED Course  Procedures (including critical care time)  Labs Review Labs Reviewed - No data to display  Imaging Review No results found.   MDM   1. Acute rhinosinusitis     Pt c/o 2 weeks of worsening sinus congestion and pressure.    Hx and exam c/w bacterial sinusitis.  Rx: Doxycycline (allergic to PCN, has had doxycyline in the past w/o reactions. She is not attempted to become pregnant, denies concern for pregnancy.)  Advised pt to use acetaminophen and ibuprofen as needed for fever and pain. Encouraged rest and fluids. F/u with PCP in 7-10 days if not improving, sooner if worsening. Pt verbalized understanding and agreement with tx plan.      Junius Finnerrin O'Malley, PA-C 05/21/15 1113

## 2015-06-22 ENCOUNTER — Emergency Department (INDEPENDENT_AMBULATORY_CARE_PROVIDER_SITE_OTHER)
Admission: EM | Admit: 2015-06-22 | Discharge: 2015-06-22 | Disposition: A | Discharge status: Home / Self Care | Payer: BLUE CROSS/BLUE SHIELD | Source: Home / Self Care | Attending: Family Medicine | Admitting: Family Medicine

## 2015-06-22 DIAGNOSIS — J069 Acute upper respiratory infection, unspecified: Secondary | ICD-10-CM

## 2015-06-22 LAB — POCT INFLUENZA A/B
Influenza A, POC: NEGATIVE
Influenza B, POC: NEGATIVE

## 2015-06-22 MED ORDER — AZITHROMYCIN 250 MG PO TABS: 250.0000 mg | ORAL_TABLET | Freq: Every day | ORAL | Status: DC

## 2015-06-22 MED ORDER — SALINE SPRAY 0.65 % NA SOLN: 1.0000 | NASAL | Status: DC | PRN

## 2015-06-22 MED ORDER — BENZONATATE 100 MG PO CAPS: 100.0000 mg | ORAL_CAPSULE | Freq: Three times a day (TID) | ORAL | Status: DC

## 2015-06-22 NOTE — Discharge Instructions (Signed)
You may take 400-600mg  Ibuprofen (Motrin) every 6-8 hours for fever and pain  Alternate with Tylenol  You may take  Tylenol every 4-6 hours as needed for fever and pain  Follow-up with your primary care provider next week for recheck of symptoms if not improving.  Be sure to drink plenty of fluids and rest, at least 8hrs of sleep a night, preferably more while you are sick. Return urgent care or go to closest ER if you cannot keep down fluids/signs of dehydration, fever not reducing with Tylenol, difficulty breathing/wheezing, stiff neck, worsening condition, or other concerns (see below)    Your symptoms are likely due to a virus such as the common cold, however, if you developing worsening chest congestion with shortness of breath, persistent fever for 3 days, or symptoms not improving in 4-5 days, you may fill the antibiotic (azithromycin).  If you do fill the antibiotic,  please take antibiotics as prescribed and be sure to complete entire course even if you start to feel better to ensure infection does not come back.  Cool Mist Vaporizers Vaporizers may help relieve the symptoms of a cough and cold. They add moisture to the air, which helps mucus to become thinner and less sticky. This makes it easier to breathe and cough up secretions. Cool mist vaporizers do not cause serious burns like hot mist vaporizers, which may also be called steamers or humidifiers. Vaporizers have not been proven to help with colds. You should not use a vaporizer if you are allergic to mold. HOME CARE INSTRUCTIONS  Follow the package instructions for the vaporizer.  Do not use anything other than distilled water in the vaporizer.  Do not run the vaporizer all of the time. This can cause mold or bacteria to grow in the vaporizer.  Clean the vaporizer after each time it is used.  Clean and dry the vaporizer well before storing it.  Stop using the vaporizer if worsening respiratory symptoms develop.   This  information is not intended to replace advice given to you by your health care provider. Make sure you discuss any questions you have with your health care provider.   Document Released: 01/24/2004 Document Revised: 05/03/2013 Document Reviewed: 09/15/2012 Elsevier Interactive Patient Education 2016 Elsevier Inc.  Upper Respiratory Infection, Adult Most upper respiratory infections (URIs) are caused by a virus. A URI affects the nose, throat, and upper air passages. The most common type of URI is often called "the common cold." HOME CARE   Take medicines only as told by your doctor.  Gargle warm saltwater or take cough drops to comfort your throat as told by your doctor.  Use a warm mist humidifier or inhale steam from a shower to increase air moisture. This may make it easier to breathe.  Drink enough fluid to keep your pee (urine) clear or pale yellow.  Eat soups and other clear broths.  Have a healthy diet.  Rest as needed.  Go back to work when your fever is gone or your doctor says it is okay.  You may need to stay home longer to avoid giving your URI to others.  You can also wear a face mask and wash your hands often to prevent spread of the virus.  Use your inhaler more if you have asthma.  Do not use any tobacco products, including cigarettes, chewing tobacco, or electronic cigarettes. If you need help quitting, ask your doctor. GET HELP IF:  You are getting worse, not better.  Your symptoms  Your symptoms are not helped by medicine. °· You have chills. °· You are getting more short of breath. °· You have brown or red mucus. °· You have yellow or brown discharge from your nose. °· You have pain in your face, especially when you bend forward. °· You have a fever. °· You have puffy (swollen) neck glands. °· You have pain while swallowing. °· You have white areas in the back of your throat. °GET HELP RIGHT AWAY IF:  °· You have very bad or constant: °¨ Headache. °¨ Ear pain. °¨ Pain  in your forehead, behind your eyes, and over your cheekbones (sinus pain). °¨ Chest pain. °· You have long-lasting (chronic) lung disease and any of the following: °¨ Wheezing. °¨ Long-lasting cough. °¨ Coughing up blood. °¨ A change in your usual mucus. °· You have a stiff neck. °· You have changes in your: °¨ Vision. °¨ Hearing. °¨ Thinking. °¨ Mood. °MAKE SURE YOU:  °· Understand these instructions. °· Will watch your condition. °· Will get help right away if you are not doing well or get worse. °  °This information is not intended to replace advice given to you by your health care provider. Make sure you discuss any questions you have with your health care provider. °  °Document Released: 10/15/2007 Document Revised: 09/12/2014 Document Reviewed: 08/03/2013 °Elsevier Interactive Patient Education ©2016 Elsevier Inc. ° °

## 2015-06-22 NOTE — ED Notes (Signed)
Started feeling bad last night with congestion, coughing, runny nose , aching, and nausea.

## 2015-06-22 NOTE — ED Provider Notes (Signed)
CSN: [id number]     Arrival date & time 06/22/15  1828 History   First MD Initiated Contact with Patient 06/22/15 1858     Chief Complaint  Patient presents with  . Cough   (Consider location/radiation/quality/duration/timing/severity/associated sxs/prior Treatment) HPI The pt is a 24yo female presenting to [facility] with c/o moderately productive cough that worsened last night but started about 1 week ago.  She reports symptoms 1 week ago started with congestion, rhinorrhea, body aches and nausea.  Cough kept pt up last night.  She has been taking OTC medications with only minimal relief.  She did get the flu vaccine this.  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  Denies recent travel.   Past Medical History  Diagnosis Date  . Recurrent sinus infections   . Seasonal allergies    History reviewed. No pertinent past surgical history. Family History  Problem Relation Age of Onset  . Hypertension Father   . Hyperlipidemia Father   . Heart attack Paternal Grandfather    Social History  Substance Use Topics  . Smoking status: Never Smoker   . Smokeless tobacco: None  . Alcohol Use: No   OB History    No data available     Review of Systems  Constitutional: Positive for fever, chills and fatigue.  HENT: Positive for congestion, ear discharge, postnasal drip, rhinorrhea, sneezing and sore throat. Negative for ear pain, sinus pressure, trouble swallowing and voice change.   Respiratory: Positive for cough. Negative for shortness of breath.   Cardiovascular: Negative for chest pain and palpitations.  Gastrointestinal: Positive for nausea. Negative for vomiting, abdominal pain and diarrhea.  Musculoskeletal: Positive for myalgias and arthralgias. Negative for back pain.  Skin: Negative for rash.  Neurological: Positive for dizziness. Negative for light-headedness and headaches.    Allergies  Penicillins  Home Medications   Prior to Admission medications   Medication Sig  Start Date End Date Taking? Authorizing Provider  azithromycin (ZITHROMAX) 250 MG tablet Take 1 tablet (250 mg total) by mouth daily. Take first 2 tablets together, then 1 every day until finished. 06/22/15   Junius Finner, PA-C  Beclomethasone Dipropionate (QNASL) 80 MCG/ACT AERS Place 2 sprays in each nostril once daily 09/18/14   [name], MD  benzonatate (TESSALON) 100 MG capsule Take 1 capsule (100 mg total) by mouth every 8 (eight) hours. 06/22/15   Junius Finner, PA-C  doxycycline (VIBRAMYCIN) 100 MG capsule Take 1 capsule (100 mg total) by mouth 2 (two) times daily. One po bid x 7 days 05/21/15   Junius Finner, PA-C  drospirenone-ethinyl estradiol (YAZ,GIANVI,LORYNA) 3-0.02 MG tablet Take 1 tablet by mouth daily.    Historical Provider, MD  magic mouthwash w/lidocaine SOLN Take 5 mLs by mouth 3 (three) times daily as needed for mouth pain. Swish, gargle, and spit. May swallow small amounts 05/03/15   Junius Finner, PA-C  sodium chloride (OCEAN) 0.65 % SOLN nasal spray Place 1 spray into both nostrils as needed. 06/22/15   Junius Finner, PA-C   Meds Ordered and Administered this Visit  Medications - No data to display  BP 125/84 mmHg  Pulse 96  Temp(Src) 98.2 F (36.8 C) (Oral)  Ht  (1.702 m)  Wt 125 lb (56.7 kg)  BMI 19.57 kg/m2  SpO2 98% No data found.   Physical Exam  Constitutional: She appears well-developed and well-nourished. No distress.  HENT:  Head: Normocephalic and atraumatic.  Right Ear: Hearing, tympanic membrane, external ear and ear canal  normal.  Left Ear: Hearing, tympanic membrane, external ear and ear canal normal.  Nose: Rhinorrhea present. Right sinus exhibits no maxillary sinus tenderness and no frontal sinus tenderness. Left sinus exhibits no maxillary sinus tenderness and no frontal sinus tenderness.  Mouth/Throat: Uvula is midline, oropharynx is clear and moist and mucous membranes are normal.  Eyes: Conjunctivae are normal. No scleral icterus.   Neck: Normal range of motion. Neck supple.  Cardiovascular: Normal rate, regular rhythm and normal heart sounds.   Pulmonary/Chest: Effort normal. No stridor. No respiratory distress. She has no wheezes. She has rhonchi ( diffuse, clears with cough). She has no rales. She exhibits no tenderness.  Moderately productive cough on exam. No respiratory distress.   Abdominal: Soft. She exhibits no distension. There is no tenderness.  Musculoskeletal: Normal range of motion.  Lymphadenopathy:    She has no cervical adenopathy.  Neurological: She is alert.  Skin: Skin is warm and dry. She is not diaphoretic.  Nursing note and vitals reviewed.   ED Course  Procedures (including critical care time)  Labs Review Labs Reviewed  POCT INFLUENZA A/B    Imaging Review No results found.     MDM   1. Acute upper respiratory infection    Pt c/o URI symptoms that suddenly worsened last night but started 1 week ago.  Sick contacts.  Pt appears well, non-toxic, is afebrile. Encouraged pt to continue symptomatic treatment. Rx: tessalon and nasal saline.  If symptoms continue to worsen or fever for 3 days, she may fill Azithromycin (prescription to hold with expiration date)  Advised pt to use acetaminophen and ibuprofen as needed for fever and pain. Encouraged rest and fluids. F/u with PCP in 5-7 days if not improving, sooner if worsening. Pt verbalized understanding and agreement with tx plan.     Junius Finner, PA-C 06/23/15 [phone]

## 2016-11-25 ENCOUNTER — Encounter: Payer: Self-pay | Admitting: Family Medicine

## 2016-11-25 ENCOUNTER — Ambulatory Visit (INDEPENDENT_AMBULATORY_CARE_PROVIDER_SITE_OTHER): Payer: BLUE CROSS/BLUE SHIELD | Admitting: Family Medicine

## 2016-11-25 DIAGNOSIS — K5900 Constipation, unspecified: Secondary | ICD-10-CM

## 2016-11-25 DIAGNOSIS — K5909 Other constipation: Secondary | ICD-10-CM | POA: Insufficient documentation

## 2016-11-25 MED ORDER — BISACODYL 10 MG RE SUPP: 10.0000 mg | RECTAL | 0 refills | Status: DC | PRN

## 2016-11-25 MED ORDER — POLYETHYLENE GLYCOL 3350 17 GM/SCOOP PO POWD: 17.0000 g | Freq: Every day | ORAL | 12 refills | Status: DC

## 2016-11-25 NOTE — Patient Instructions (Signed)
Thank you for coming in today. Take miralax daily or twice daily with plenty of water to have a soft bowel movement most of the days.  Use a Fleets enema or bisacodyl or glycerine suppository as needed.   Return if not better.    Constipation, Adult Constipation is when a person has fewer bowel movements in a week than normal, has difficulty having a bowel movement, or has stools that are dry, hard, or larger than normal. Constipation may be caused by an underlying condition. It may become worse with age if a person takes certain medicines and does not take in enough fluids. Follow these instructions at home: Eating and drinking   Eat foods that have a lot of fiber, such as fresh fruits and vegetables, whole grains, and beans.  Limit foods that are high in fat, low in fiber, or overly processed, such as french fries, hamburgers, cookies, candies, and soda.  Drink enough fluid to keep your urine clear or pale yellow. General instructions  Exercise regularly or as told by your health care provider.  Go to the restroom when you have the urge to go. Do not hold it in.  Take over-the-counter and prescription medicines only as told by your health care provider. These include any fiber supplements.  Practice pelvic floor retraining exercises, such as deep breathing while relaxing the lower abdomen and pelvic floor relaxation during bowel movements.  Watch your condition for any changes.  Keep all follow-up visits as told by your health care provider. This is important. Contact a health care provider if:  You have pain that gets worse.  You have a fever.  You do not have a bowel movement after 4 days.  You vomit.  You are not hungry.  You lose weight.  You are bleeding from the anus.  You have thin, pencil-like stools. Get help right away if:  You have a fever and your symptoms suddenly get worse.  You leak stool or have blood in your stool.  Your abdomen is  bloated.  You have severe pain in your abdomen.  You feel dizzy or you faint. This information is not intended to replace advice given to you by your health care provider. Make sure you discuss any questions you have with your health care provider. Document Released: 01/25/2004 Document Revised: 11/16/2015 Document Reviewed: 10/17/2015 Elsevier Interactive Patient Education  2017 Elsevier Inc.    Linaclotide oral capsules What is this medicine? LINACLOTIDE (lin a KLOE tide) is used to treat irritable bowel syndrome (IBS) with constipation as the main problem. It may also be used for relief of chronic constipation. This medicine may be used for other purposes; ask your health care provider or pharmacist if you have questions. COMMON BRAND NAME(S): Linzess What should I tell my health care provider before I take this medicine? They need to know if you have any of these conditions: -history of stool (fecal) impaction -now have diarrhea or have diarrhea often -other medical condition -stomach or intestinal disease, including bowel obstruction or abdominal adhesions -an unusual or allergic reaction to linaclotide, other medicines, foods, dyes, or preservatives -pregnant or trying to get pregnant -breast-feeding How should I use this medicine? Take this medicine by mouth with a glass of water. Follow the directions on the prescription label. Do not cut, crush or chew this medicine. Take on an empty stomach, at least 30 minutes before your first meal of the day. Take your medicine at regular intervals. Do not take your medicine more  often than directed. Do not stop taking except on your doctor's advice. A special MedGuide will be given to you by the pharmacist with each prescription and refill. Be sure to read this information carefully each time. Talk to your pediatrician regarding the use of this medicine in children. This medicine is not approved for use in children. Overdosage: If you  think you have taken too much of this medicine contact a poison control center or emergency room at once. NOTE: This medicine is only for you. Do not share this medicine with others. What if I miss a dose? If you miss a dose, just skip that dose. Wait until your next dose, and take only that dose. Do not take double or extra doses. What may interact with this medicine? -certain medicines for bowel problems or bladder incontinence (these can cause constipation) This list may not describe all possible interactions. Give your health care provider a list of all the medicines, herbs, non-prescription drugs, or dietary supplements you use. Also tell them if you smoke, drink alcohol, or use illegal drugs. Some items may interact with your medicine. What should I watch for while using this medicine? Visit your doctor for regular check ups. Tell your doctor if your symptoms do not get better or if they get worse. Diarrhea is a common side effect of this medicine. It often begins within 2 weeks of starting this medicine. Stop taking this medicine and call your doctor if you get severe diarrhea. Stop taking this medicine and call your doctor or go to the nearest hospital emergency room right away if you develop unusual or severe stomach-area (abdominal) pain, especially if you also have bright red, bloody stools or black stools that look like tar. What side effects may I notice from receiving this medicine? Side effects that you should report to your doctor or health care professional as soon as possible: -allergic reactions like skin rash, itching or hives, swelling of the face, lips, or tongue -black, tarry stools -bloody or watery diarrhea -new or worsening stomach pain -severe or prolonged diarrhea Side effects that usually do not require medical attention (report to your doctor or health care professional if they continue or are bothersome): -bloating -gas -loose stools This list may not describe all  possible side effects. Call your doctor for medical advice about side effects. You may report side effects to FDA at 1-800-FDA-1088. Where should I keep my medicine? Keep out of the reach of children. Store at room temperature between 20 and 25 degrees C (68 and 77 degrees F). Keep this medicine in the original container. Keep tightly closed in a dry place. Do not remove the desiccant packet from the bottle, it helps to protect your medicine from moisture. Throw away any unused medicine after the expiration date. NOTE: This sheet is a summary. It may not cover all possible information. If you have questions about this medicine, talk to your doctor, pharmacist, or health care provider.  2018 Elsevier/Gold Standard (2015-05-31 12:17:04)

## 2016-11-26 NOTE — Progress Notes (Signed)
       [name] L Bulkley is a 26 y.o. female who [facility] today for constipation: Patient is a one-month history of intermittent constipation worsening over the last 2 weeks. She's used some over-the-counter laxatives since have helped. She's gone up to 5 or 6 days without a bowel movement. She notes bloating and discomfort. She denies any vomiting. She has not changed her diet. She eats plenty of fiber. No Fevers or chills.   Past Medical History:  Diagnosis Date  . Recurrent sinus infections   . Seasonal allergies    No past surgical history on file. Social History  Substance Use Topics  . Smoking status: Never Smoker  . Smokeless tobacco: Never Used  . Alcohol use No   family history includes Heart attack in her paternal grandfather; Hyperlipidemia in her father; Hypertension in her father.  ROS as above:  Medications: Current Outpatient Prescriptions  Medication Sig Dispense Refill  . drospirenone-ethinyl estradiol (YAZ,GIANVI,LORYNA) 3-0.02 MG tablet Take 1 tablet by mouth daily.    . bisacodyl (DULCOLAX) 10 MG suppository Place 1 suppository (10 mg total) rectally as needed for moderate constipation. 12 suppository 0  . polyethylene glycol powder (GLYCOLAX/MIRALAX) powder Take 17 g by mouth daily. 850 g 12   No current facility-administered medications for this visit.    Allergies  Allergen Reactions  . Penicillins     Health Maintenance Health Maintenance  Topic Date Due  . HIV Screening  03/18/2005  . TETANUS/TDAP  03/18/2009  . PAP SMEAR  01/11/2016  . INFLUENZA VACCINE  12/10/2016     Exam:  BP 125/81   Pulse 73   Ht 5\' 7"  (1.702 m)   Wt 126 lb (57.2 kg)   BMI 19.73 kg/m  Gen: Well NAD HEENT: EOMI,  MMM Lungs: Normal work of breathing. CTABL Heart: RRR no MRG Abd: NABS, Soft. Nondistended, Nontender Exts: Brisk capillary  refill, warm and well perfused.    No results found for this or any previous visit (from the past 72 hour(s)). No results found.    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 Plan for treatment with oral MiraLAX. Intermittent bisacodyl suppositories as needed. We'll prescribe Linzess if not improving.   No orders of the defined types were placed in this encounter.  Meds ordered this encounter  Medications  . polyethylene glycol powder (GLYCOLAX/MIRALAX) powder    Sig: Take 17 g by mouth daily.    Dispense:  850 g    Refill:  12  . bisacodyl (DULCOLAX) 10 MG suppository    Sig: Place 1 suppository (10 mg total) rectally as needed for moderate constipation.    Dispense:  12 suppository    Refill:  0     Discussed warning signs or symptoms. Please see discharge instructions. Patient expresses understanding.

## 2016-11-28 ENCOUNTER — Ambulatory Visit: Payer: BLUE CROSS/BLUE SHIELD | Admitting: Physician Assistant

## 2016-11-28 ENCOUNTER — Telehealth: Payer: Self-pay

## 2016-11-28 MED ORDER — LINACLOTIDE 145 MCG PO CAPS: 145.0000 ug | ORAL_CAPSULE | Freq: Every day | ORAL | 0 refills | Status: DC

## 2016-11-28 NOTE — Telephone Encounter (Signed)
I sent in a Rx for Linzess.  You can make this medicine cheaper with manufacturer coupon searching for Linzess savings card and going to the official website.   Additionally you could pick up a coupon at the clinic.   Schedule a follow appointment with me next week

## 2016-11-28 NOTE — Telephone Encounter (Signed)
Spoke with pt who saw you on Tuesday for constipation, states powder isn't helping and that she felt uncomfortable doing the suppository methods. States when she used enema she had a small movement but she's still very uncomfortable. Would like to know if there's anything else she can try orally and if a scan would be an option at this point. Please advise.

## 2016-11-28 NOTE — Telephone Encounter (Signed)
Pt notified. Stated that she was just released from ER for constipation and has already scheduled a f/u but will try medication.

## 2016-12-03 ENCOUNTER — Encounter: Payer: Self-pay | Admitting: Physician Assistant

## 2016-12-03 ENCOUNTER — Ambulatory Visit (INDEPENDENT_AMBULATORY_CARE_PROVIDER_SITE_OTHER): Payer: BLUE CROSS/BLUE SHIELD | Admitting: Physician Assistant

## 2016-12-03 VITALS — BP 115/71 | HR 77 | Wt 128.0 lb

## 2016-12-03 DIAGNOSIS — K5909 Other constipation: Secondary | ICD-10-CM

## 2016-12-03 LAB — CBC WITH DIFFERENTIAL/PLATELET
Basophils Absolute: 0 cells/uL (ref 0–200)
Basophils Relative: 0 %
Eosinophils Absolute: 124 cells/uL (ref 15–500)
Eosinophils Relative: 2 %
HEMATOCRIT: 38.7 % (ref 35.0–45.0)
HEMOGLOBIN: 12.9 g/dL (ref 11.7–15.5)
LYMPHS ABS: 2666 {cells}/uL (ref 850–3900)
Lymphocytes Relative: 43 %
MCH: 29.6 pg (ref 27.0–33.0)
MCHC: 33.3 g/dL (ref 32.0–36.0)
MCV: 88.8 fL (ref 80.0–100.0)
MPV: 9.7 fL (ref 7.5–12.5)
Monocytes Absolute: 310 cells/uL (ref 200–950)
Monocytes Relative: 5 %
NEUTROS PCT: 50 %
Neutro Abs: 3100 cells/uL (ref 1500–7800)
Platelets: 293 10*3/uL (ref 140–400)
RBC: 4.36 MIL/uL (ref 3.80–5.10)
RDW: 12.1 % (ref 11.0–15.0)
WBC: 6.2 10*3/uL (ref 3.8–10.8)

## 2016-12-03 LAB — COMPREHENSIVE METABOLIC PANEL
ALT: 17 U/L (ref 6–29)
AST: 18 U/L (ref 10–30)
Albumin: 4.4 g/dL (ref 3.6–5.1)
Alkaline Phosphatase: 49 U/L (ref 33–115)
BUN: 11 mg/dL (ref 7–25)
CO2: 24 mmol/L (ref 20–31)
Calcium: 9.7 mg/dL (ref 8.6–10.2)
Chloride: 99 mmol/L (ref 98–110)
Creat: 0.73 mg/dL (ref 0.50–1.10)
Glucose, Bld: 78 mg/dL (ref 65–99)
Potassium: 4.3 mmol/L (ref 3.5–5.3)
Sodium: 137 mmol/L (ref 135–146)
Total Bilirubin: 0.7 mg/dL (ref 0.2–1.2)
Total Protein: 7.2 g/dL (ref 6.1–8.1)

## 2016-12-03 LAB — TSH: TSH: 2.39 m[IU]/L

## 2016-12-03 NOTE — Progress Notes (Signed)
HPI:                                                                April Frost is a 26 y.o. female who [facility] today for constipation follow-up  Constipation  This is a recurrent problem. The current episode started more than 1 month ago. The problem has been gradually worsening since onset. The patient is on a high fiber diet. She exercises regularly. There has been adequate water intake. Associated symptoms include abdominal pain, anorexia and bloating. Pertinent negatives include no fecal incontinence, fever, hematochezia, melena, nausea, rectal pain, vomiting or weight loss. She has tried laxatives, stool softeners, fiber and diet changes for the symptoms. The treatment provided no relief. There is no history of abdominal surgery, endocrine disease, inflammatory bowel disease, irritable bowel syndrome or neurologic disease.   Patient was seen in the ER on Friday for constipation / abdominal pain. She was treated with magnesium and able to have a bowel movement. She states she has been unable to have a BM since that time. She filled the prescription for Linzess, but wants to know if she should take it.   Past Medical History:  Diagnosis Date  . Recurrent sinus infections   . Seasonal allergies    No past surgical history on file. Social History  Substance Use Topics  . Smoking status: Never Smoker  . Smokeless tobacco: Never Used  . Alcohol use No   family history includes Heart attack in her paternal grandfather; Hyperlipidemia in her father; Hypertension in her father.  ROS: negative except as noted in the HPI  Medications: Current Outpatient Prescriptions  Medication Sig Dispense Refill  . bisacodyl (DULCOLAX) 10 MG suppository Place 1 suppository (10 mg total) rectally as needed for moderate constipation. 12 suppository 0  . drospirenone-ethinyl estradiol (YAZ,GIANVI,LORYNA) 3-0.02 MG tablet Take 1 tablet  by mouth daily.    . polyethylene glycol powder (GLYCOLAX/MIRALAX) powder Take 17 g by mouth daily. 850 g 12  . linaclotide (LINZESS) 145 MCG CAPS capsule Take 1 capsule (145 mcg total) by mouth daily. (Patient not taking: Reported on 12/03/2016) 30 capsule 0   No current facility-administered medications for this visit.    Allergies  Allergen Reactions  . Penicillins        Objective:  BP 115/71   Pulse 77   Wt 128 lb (58.1 kg)   SpO2 99%   BMI 20.05 kg/m  Gen: well-groomed, cooperative, not ill-appearing, no distress HEENT: normal conjunctiva, trachea midline Pulm: Normal work of breathing, normal phonation Neuro: alert and oriented x 3, EOM's intact, no tremor MSK: extremities atraumatic, normal gait and station, no peripheral edema Skin:  no rashes on exposed skin, no cyanosis Psych: good eye contact, normal affect, normal speech and thought content   No results found for this or any previous visit (from the past 72 hour(s)). No results found.    Assessment and Plan: 26 y.o. female with   1. Chronic constipation - will check basic labs. This is likely chronic idiopathic constipation - encouraged patient to fill Linzess and instructed on administration before breakfast on an empty stomach. Discontinue OTC laxatives - TSH - Comprehensive metabolic panel - CBC with Differential/Platelet   Patient  education and anticipatory guidance given Patient agrees with treatment plan Follow-up as needed if symptoms worsen or fail to improve  Darlyne Russian PA-C

## 2016-12-03 NOTE — Patient Instructions (Addendum)
-   Start Linzess 30 minutes before breakfast on an empty stomach daily

## 2016-12-04 ENCOUNTER — Other Ambulatory Visit: Payer: Self-pay | Admitting: Physician Assistant

## 2016-12-04 DIAGNOSIS — K5909 Other constipation: Secondary | ICD-10-CM

## 2016-12-04 NOTE — Progress Notes (Signed)
Labs look great Normal metabolic panel No evidence of thyroid disease Normal blood counts

## 2016-12-22 ENCOUNTER — Encounter: Payer: Self-pay | Admitting: *Deleted

## 2016-12-22 ENCOUNTER — Emergency Department (INDEPENDENT_AMBULATORY_CARE_PROVIDER_SITE_OTHER): Payer: BLUE CROSS/BLUE SHIELD

## 2016-12-22 ENCOUNTER — Emergency Department
Admission: EM | Admit: 2016-12-22 | Discharge: 2016-12-22 | Disposition: A | Discharge status: Home / Self Care | Payer: BLUE CROSS/BLUE SHIELD | Source: Home / Self Care | Attending: Family Medicine | Admitting: Family Medicine

## 2016-12-22 DIAGNOSIS — S60021A Contusion of right index finger without damage to nail, initial encounter: Secondary | ICD-10-CM

## 2016-12-22 DIAGNOSIS — S60410A Abrasion of right index finger, initial encounter: Secondary | ICD-10-CM

## 2016-12-22 DIAGNOSIS — M79644 Pain in right finger(s): Secondary | ICD-10-CM | POA: Diagnosis not present

## 2016-12-22 MED ORDER — IBUPROFEN 400 MG PO TABS
400.0000 mg | ORAL_TABLET | Freq: Once | ORAL | Status: AC
  Administered 2016-12-22: 400 mg via ORAL

## 2016-12-22 NOTE — Discharge Instructions (Signed)
Change dressing daily and apply Bacitracin ointment to wound until healed.  Keep wound clean and dry.  Return for any signs of infection (or follow-up with family doctor):  Increasing redness, swelling, pain, heat, drainage, etc.  Buddy tape finger for several days until pain decreases.  Begin range of motion and stretching exercises as tolerated.  May take Ibuprofen 200mg , 3 or 4 tabs every 8 hours with food.

## 2016-12-22 NOTE — ED Triage Notes (Signed)
Pt c/o RT index finger abrasion and pain x 1100 today after closing her finger in the hinge of a door. Tdap 3 years ago. No OTC meds.

## 2016-12-22 NOTE — ED Provider Notes (Addendum)
[facility]    CSN: [id number] Arrival date & time: 12/22/16  1140     History   Chief Complaint Chief Complaint  Patient presents with  . Abrasion    HPI April Frost is a 26 y.o. female.   Patient accidentally closed a door on her right second finger about two hours ago.  Her last Tdap was three years ago.   The history is provided by the patient.  Hand Pain  This is a new problem. Episode onset: 2 hours ago. The problem occurs constantly. The problem has not changed since onset.Exacerbated by: flexing finger. Nothing relieves the symptoms. Treatments tried: ice pack. The treatment provided mild relief.    Past Medical History:  Diagnosis Date  . Recurrent sinus infections   . Seasonal allergies     Patient Active Problem List   Diagnosis Date Noted  . Chronic constipation 11/25/2016  . Syncope and collapse 12/05/2013    History reviewed. No pertinent surgical history.  OB History    No data available       Home Medications    Prior to Admission medications   Medication Sig Start Date End Date Taking? Authorizing Provider  drospirenone-ethinyl estradiol (YAZ,GIANVI,LORYNA) 3-0.02 MG tablet Take 1 tablet by mouth daily.    [provider]  linaclotide (LINZESS) 145 MCG CAPS capsule Take 1 capsule (145 mcg total) by mouth daily. Patient not taking: Reported on 12/03/2016 11/28/16   [name], [name], MD    Family History Family History  Problem Relation Age of Onset  . Hypertension Father   . Hyperlipidemia Father   . Heart attack Paternal Grandfather     Social History Social History  Substance Use Topics  . Smoking status: Never Smoker  . Smokeless tobacco: Never Used  . Alcohol use No     Allergies   Penicillins   Review of Systems Review of Systems  All other systems reviewed and are negative.    Physical Exam Triage Vital Signs ED Triage Vitals  Enc Vitals Group     BP 12/22/16 1230 106/68     Pulse Rate  12/22/16 1230 66     Resp 12/22/16 1230 16     Temp 12/22/16 1230 98.1 F (36.7 C)     Temp Source 12/22/16 1230 Oral     SpO2 12/22/16 1230 97 %     Weight 12/22/16 1230 125 lb (56.7 kg)     Height 12/22/16 1230 5\' 6"  (1.676 m)     Head Circumference --      Peak Flow --      Pain Score 12/22/16 1231 6     Pain Loc --      Pain Edu? --      Excl. in GC? --    No data found.   Updated Vital Signs BP 106/68 (BP Location: Left Arm)   Pulse 66   Temp 98.1 F (36.7 C) (Oral)   Resp 16   Ht 5\' 6"  (1.676 m)   Wt 125 lb (56.7 kg)   LMP 12/09/2016   SpO2 97%   BMI 20.18 kg/m   Visual Acuity Right Eye Distance:   Left Eye Distance:   Bilateral Distance:    Right Eye Near:   Left Eye Near:    Bilateral Near:     Physical Exam  Constitutional: She appears well-developed and well-nourished. No distress.  HENT:  Head: Atraumatic.  Eyes: Pupils are equal, round, and reactive to light.  Neck: Normal range of motion.  Cardiovascular: Normal rate.   Pulmonary/Chest: Effort normal.  Musculoskeletal:       Right hand: She exhibits decreased range of motion, tenderness, bony tenderness and swelling. She exhibits normal two-point discrimination, normal capillary refill, no deformity and no laceration.       Hands: Left second finger has mild decreased range of motion PIP and DIP joints.  There is tenderness to palpation over the middle phalanx.  The middle phalanx has a 4mm superficial abrasion  as noted on diagram.   Distal neurovascular function is intact.   Neurological: She is alert.  Skin: Skin is warm and dry.  Nursing note and vitals reviewed.    UC Treatments / Results  Labs (all labs ordered are listed, but only abnormal results are displayed) Labs Reviewed - No data to display  EKG  EKG Interpretation None       Radiology Dg Finger Index Right  Result Date: 12/22/2016 CLINICAL DATA:  Shot RIGHT index finger in door today at 1100 hours, abrasion near PIP  joint EXAM: RIGHT INDEX FINGER 2+V COMPARISON:  None FINDINGS: Mild diffuse soft tissue swelling greatest at PIP joint. Osseous mineralization normal. Joint spaces preserved. No fracture, dislocation, or bone destruction. IMPRESSION: No acute osseous abnormalities. Electronically Signed   By: Ulyses Southward M.D.   On: 12/22/2016 12:43    Procedures Procedures (including critical care time)  Medications Ordered in UC Medications  ibuprofen (ADVIL,MOTRIN) tablet 400 mg (400 mg Oral Given 12/22/16 1218)     Initial Impression / Assessment and Plan / UC Course  I have reviewed the triage vital signs and the nursing notes.  Pertinent labs & imaging results that were available during my care of the patient were reviewed by me and considered in my medical decision making (see chart for details).    Wound dressed with Bacitracin and bandaid. Finger strapped using "Buddy Tape" technique.   Change dressing daily and apply Bacitracin ointment to wound until healed.  Keep wound clean and dry.  Return for any signs of infection (or follow-up with family doctor):  Increasing redness, swelling, pain, heat, drainage, etc.  Buddy tape finger for several days until pain decreases.  Begin range of motion and stretching exercises as tolerated. May take Ibuprofen 200mg , 3 or 4 tabs every 8 hours with food.  Followup with Dr. Rodney Langton or Dr. Clementeen [facility] ([facility]) if not improving about two weeks.     Final Clinical Impressions(s) / UC Diagnoses   Final diagnoses:  Contusion of right index finger without damage to nail, initial encounter  Abrasion of right index finger, initial encounter    New Prescriptions New Prescriptions   No medications on file         [name], MD 12/22/16 1425    [name], MD 12/22/16 1426

## 2017-02-01 ENCOUNTER — Encounter: Payer: Self-pay | Admitting: Emergency Medicine

## 2017-02-01 ENCOUNTER — Emergency Department
Admission: EM | Admit: 2017-02-01 | Discharge: 2017-02-01 | Disposition: A | Discharge status: Home / Self Care | Payer: BLUE CROSS/BLUE SHIELD | Source: Home / Self Care | Attending: Family Medicine | Admitting: Family Medicine

## 2017-02-01 DIAGNOSIS — J0100 Acute maxillary sinusitis, unspecified: Secondary | ICD-10-CM | POA: Diagnosis not present

## 2017-02-01 MED ORDER — AZITHROMYCIN 250 MG PO TABS: 250.0000 mg | ORAL_TABLET | Freq: Every day | ORAL | 0 refills | Status: DC

## 2017-02-01 NOTE — ED Triage Notes (Signed)
Patient complaining of facial and sinus pain this week, sore throat on Thursday, strep was negative, sinus pain has worsen, taking Mucinex for 4 days no better, itchy ears.

## 2017-02-01 NOTE — ED Provider Notes (Signed)
[facility]    CSN: [id number] Arrival date & time: 02/01/17  1108     History   Chief Complaint Chief Complaint  Patient presents with  . Facial Pain    HPI April Frost is a 26 y.o. female.   HPI April L Henneman is a 26 y.o. female presenting to UC with c/o 1 week of worsening sinus pain and pressure with congestion.  Symptoms started with a sore throat earlier this week, she tested Negative for strep but has had worsening frontal headache.  She has taken Mucinex for 4 days w/o relief. Mildly itchy ears. Mild intermittent cough. Denies fever, n/v/d.    Past Medical History:  Diagnosis Date  . Recurrent sinus infections   . Seasonal allergies     Patient Active Problem List   Diagnosis Date Noted  . Chronic constipation 11/25/2016  . Syncope and collapse 12/05/2013    History reviewed. No pertinent surgical history.  OB History    No data available       Home Medications    Prior to Admission medications   Medication Sig Start Date End Date Taking? Authorizing Provider  Homeopathic Products (ALLERGY MEDICINE PO) Take by mouth.   Yes [provider]  azithromycin (ZITHROMAX) 250 MG tablet Take 1 tablet (250 mg total) by mouth daily. Take first 2 tablets together, then 1 every day until finished. 02/01/17   Lurene Shadow, PA-C  drospirenone-ethinyl estradiol (YAZ,GIANVI,LORYNA) 3-0.02 MG tablet Take 1 tablet by mouth daily.    [provider]    Family History Family History  Problem Relation Age of Onset  . Hypertension Father   . Hyperlipidemia Father   . Heart attack Paternal Grandfather     Social History Social History  Substance Use Topics  . Smoking status: Never Smoker  . Smokeless tobacco: Never Used  . Alcohol use No     Allergies   Penicillins   Review of Systems Review of Systems  Constitutional: Negative for chills and fever.  HENT: Positive for congestion, postnasal drip, sinus pain, sinus  pressure and sore throat. Negative for ear pain, trouble swallowing and voice change.   Respiratory: Positive for cough. Negative for shortness of breath.   Cardiovascular: Negative for chest pain and palpitations.  Gastrointestinal: Negative for abdominal pain, diarrhea, nausea and vomiting.  Musculoskeletal: Negative for arthralgias, back pain and myalgias.  Skin: Negative for rash.  Neurological: Positive for headaches. Negative for dizziness and light-headedness.     Physical Exam Triage Vital Signs ED Triage Vitals  Enc Vitals Group     BP 02/01/17 1126 113/80     Pulse Rate 02/01/17 1126 76     Resp --      Temp 02/01/17 1126 98.6 F (37 C)     Temp Source 02/01/17 1126 Oral     SpO2 02/01/17 1126 100 %     Weight 02/01/17 1127 124 lb (56.2 kg)     Height 02/01/17 1127  (1.676 m)     Head Circumference --      Peak Flow --      Pain Score 02/01/17 1127 7     Pain Loc --      Pain Edu? --      Excl. in GC? --    No data found.   Updated Vital Signs BP 113/80 (BP Location: Left Arm)   Pulse 76   Temp 98.6 F (37 C) (Oral)   Ht  (1.676  m)   Wt 124 lb (56.2 kg)   LMP 01/08/2017 (Exact Date)   SpO2 100%   BMI 20.01 kg/m   Visual Acuity Right Eye Distance:   Left Eye Distance:   Bilateral Distance:    Right Eye Near:   Left Eye Near:    Bilateral Near:     Physical Exam  Constitutional: She is oriented to person, place, and time. She appears well-developed and well-nourished. No distress.  HENT:  Head: Normocephalic and atraumatic.  Right Ear: Tympanic membrane normal.  Left Ear: Tympanic membrane normal.  Nose: Mucosal edema present. Right sinus exhibits maxillary sinus tenderness. Right sinus exhibits no frontal sinus tenderness. Left sinus exhibits maxillary sinus tenderness. Left sinus exhibits no frontal sinus tenderness.  Mouth/Throat: Uvula is midline, oropharynx is clear and moist and mucous membranes are normal.  Eyes: EOM are normal.    Neck: Normal range of motion. Neck supple.  Cardiovascular: Normal rate and regular rhythm.   Pulmonary/Chest: Effort normal and breath sounds normal. No stridor. No respiratory distress. She has no wheezes. She has no rales.  Musculoskeletal: Normal range of motion.  Lymphadenopathy:    She has no cervical adenopathy.  Neurological: She is alert and oriented to person, place, and time.  Skin: Skin is warm and dry. She is not diaphoretic.  Psychiatric: She has a normal mood and affect. Her behavior is normal.  Nursing note and vitals reviewed.    UC Treatments / Results  Labs (all labs ordered are listed, but only abnormal results are displayed) Labs Reviewed - No data to display  EKG  EKG Interpretation None       Radiology No results found.  Procedures Procedures (including critical care time)  Medications Ordered in UC Medications - No data to display   Initial Impression / Assessment and Plan / UC Course  I have reviewed the triage vital signs and the nursing notes.  Pertinent labs & imaging results that were available during my care of the patient were reviewed by me and considered in my medical decision making (see chart for details).     Hx and exam c/w maxillary sinusitis  Will cover for bacterial cause. Home care instructions provided F/u with PCP in 1 week if needed.   Final Clinical Impressions(s) / UC Diagnoses   Final diagnoses:  Acute non-recurrent maxillary sinusitis    New Prescriptions Discharge Medication List as of 02/01/2017 11:47 AM    START taking these medications   Details  azithromycin (ZITHROMAX) 250 MG tablet Take 1 tablet (250 mg total) by mouth daily. Take first 2 tablets together, then 1 every day until finished., Starting Sun 02/01/2017, Normal         Controlled Substance Prescriptions [place] Controlled Substance Registry consulted? Not Applicable   Rolla Plate 02/01/17 1237

## 2017-02-01 NOTE — Discharge Instructions (Signed)
°  You may take 500mg acetaminophen every 4-6 hours or in combination with ibuprofen 400-600mg every 6-8 hours as needed for pain, inflammation, and fever. ° °Be sure to drink at least eight 8oz glasses of water to stay well hydrated and get at least 8 hours of sleep at night, preferably more while sick.  ° °

## 2018-03-29 ENCOUNTER — Telehealth: Payer: Self-pay

## 2018-03-29 NOTE — Telephone Encounter (Signed)
Pt has not seen Jade since 2015. Lesly RubensteinJade has been removed from profile as PCP. Will have to have OK to reestablish.

## 2018-04-15 ENCOUNTER — Encounter: Payer: Self-pay | Admitting: Family Medicine

## 2018-04-15 ENCOUNTER — Emergency Department (INDEPENDENT_AMBULATORY_CARE_PROVIDER_SITE_OTHER)
Admission: EM | Admit: 2018-04-15 | Discharge: 2018-04-15 | Disposition: A | Discharge status: Home / Self Care | Payer: 59 | Source: Home / Self Care

## 2018-04-15 ENCOUNTER — Other Ambulatory Visit: Payer: Self-pay

## 2018-04-15 DIAGNOSIS — J029 Acute pharyngitis, unspecified: Secondary | ICD-10-CM | POA: Diagnosis not present

## 2018-04-15 LAB — POCT RAPID STREP A (OFFICE): Rapid Strep A Screen: NEGATIVE

## 2018-04-15 MED ORDER — CHLORHEXIDINE GLUCONATE 0.12 % MT SOLN: 15.0000 mL | Freq: Two times a day (BID) | OROMUCOSAL | 0 refills | Status: AC

## 2018-04-15 NOTE — ED Triage Notes (Signed)
April Frost complains of sore throat for 2 days. She is a little worried it may be strep throat. Denies cough, fever, chills or sweats.

## 2018-04-15 NOTE — ED Provider Notes (Signed)
[facility]    CSN: [id number] Arrival date & time: 04/15/18  1426     History   Chief Complaint Chief Complaint  Patient presents with  . Sore Throat    HPI April Frost is a 27 y.o. female.   27 year old established [employer] urgent care patient who presents with sore throat.  The symptoms began 2 days ago.  She often has a sore throat with subsequent congestion but this is not been the case this time.  She does have myalgias, fatigue, and a curious itching sensation in her mouth along her gums.    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     Past Medical History:  Diagnosis Date  . Recurrent sinus infections   . Seasonal allergies     Patient Active Problem List   Diagnosis Date Noted  . Chronic constipation 11/25/2016  . Syncope and collapse 12/05/2013    History reviewed. No pertinent surgical history.  OB History   None      Home Medications    Prior to Admission medications   Medication Sig Start Date End Date Taking? Authorizing Provider  azithromycin (ZITHROMAX) 250 MG tablet Take 1 tablet (250 mg total) by mouth daily. Take first 2 tablets together, then 1 every day until finished. 02/01/17   Lurene Shadow, PA-C  drospirenone-ethinyl estradiol (YAZ,GIANVI,LORYNA) 3-0.02 MG tablet Take 1 tablet by mouth daily.    [provider]  Homeopathic Products (ALLERGY MEDICINE PO) Take by mouth.    [provider]    Family History Family History  Problem Relation Age of Onset  . Hypertension Father   . Hyperlipidemia Father   . Heart attack Paternal Grandfather     Social History Social History   Tobacco Use  . Smoking status: Never Smoker  . Smokeless tobacco: Never Used  Substance Use Topics  . Alcohol use: No  . Drug use: No     Allergies   Penicillins   Review of Systems Review of Systems  Constitutional: Positive for fatigue. Negative for fever.  HENT:  Positive for sore throat. Negative for congestion and sinus pressure.   Respiratory: Negative for cough.   Musculoskeletal: Positive for myalgias.  All other systems reviewed and are negative.    Physical Exam Triage Vital Signs ED Triage Vitals  Enc Vitals Group     BP      Pulse      Resp      Temp      Temp src      SpO2      Weight      Height      Head Circumference      Peak Flow      Pain Score      Pain Loc      Pain Edu?      Excl. in GC?    No data found.  Updated Vital Signs BP 109/71 (BP Location: Right Arm)   Pulse 63   Temp 97.9 F (36.6 C) (Oral)   Resp 16   Ht 5\' 6"  (1.676 m)   Wt 58.1 kg   SpO2 100%   BMI 20.66 kg/m    Physical Exam  Constitutional: She is oriented to person, place, and time. She appears well-developed and well-nourished.  HENT:  Head: Normocephalic and atraumatic.  Right Ear: Hearing, tympanic membrane and ear canal normal.  Left Ear: Hearing, tympanic membrane and ear canal normal.  Mouth/Throat: Uvula is midline, oropharynx is clear and moist and mucous membranes are normal.  Eyes: Pupils are equal, round, and reactive to light. EOM are normal.  Neck: Normal range of motion. Neck supple.  Pulmonary/Chest: Effort normal.  Lymphadenopathy:    She has no cervical adenopathy.  Neurological: She is alert and oriented to person, place, and time.  Skin: Skin is warm and dry.  Psychiatric: She has a normal mood and affect. Her behavior is normal.  Nursing note and vitals reviewed.    UC Treatments / Results  Labs (all labs ordered are listed, but only abnormal results are displayed) Labs Reviewed - No data to display  EKG None  Radiology No results found.  Procedures Procedures (including critical care time)  Medications Ordered in UC Medications - No data to display  Initial Impression / Assessment and Plan / UC Course  I have reviewed the triage vital signs and the nursing notes.  Pertinent labs & imaging  results that were available during my care of the patient were reviewed by me and considered in my medical decision making (see chart for details).    Final Clinical Impressions(s) / UC Diagnoses   Final diagnoses:  None   Discharge Instructions   None    ED Prescriptions    None     Controlled Substance Prescriptions  Controlled Substance Registry consulted? Not Applicable   Elvina SidleLauenstein, Sigmond Patalano, MD 04/15/18 1454

## 2018-04-15 NOTE — Discharge Instructions (Addendum)
The strep test is negative.  Generally we use Tylenol or Advil for the muscle soreness.  Usually symptoms subside in 2 to 3 days.

## 2018-05-15 ENCOUNTER — Emergency Department: Admission: EM | Admit: 2018-05-15 | Discharge: 2018-05-15 | Payer: BLUE CROSS/BLUE SHIELD | Source: Home / Self Care

## 2018-06-18 ENCOUNTER — Telehealth: Payer: Self-pay

## 2018-06-18 MED ORDER — OSELTAMIVIR PHOSPHATE 75 MG PO CAPS: 75.0000 mg | ORAL_CAPSULE | Freq: Every day | ORAL | 0 refills | Status: AC

## 2018-06-18 NOTE — Telephone Encounter (Signed)
Sent!

## 2018-06-18 NOTE — Telephone Encounter (Signed)
April Frost called and states she believes [name] is her PCP. No PCP in medical record.   She states her husband was diagnosed with the flu. She reports no symptoms. She was wanting Tamiflu.   CMP     Component Value Date/Time   NA 137 12/03/2016 1506   K 4.3 12/03/2016 1506   CL 99 12/03/2016 1506   CO2 24 12/03/2016 1506   GLUCOSE 78 12/03/2016 1506   BUN 11 12/03/2016 1506   CREATININE 0.73 12/03/2016 1506   CALCIUM 9.7 12/03/2016 1506   PROT 7.2 12/03/2016 1506   ALBUMIN 4.4 12/03/2016 1506   AST 18 12/03/2016 1506   ALT 17 12/03/2016 1506   ALKPHOS 49 12/03/2016 1506   BILITOT 0.7 12/03/2016 1506

## 2018-06-18 NOTE — Telephone Encounter (Signed)
Left message advising patient

## 2019-02-10 ENCOUNTER — Telehealth: Payer: Self-pay

## 2019-02-10 IMAGING — DX DG FINGER INDEX 2+V*R*
3 series · 3 of 3 positions shown · non-contrast
Comparison: None

CLINICAL DATA: Shot RIGHT index finger in door today at 5522 hours,
abrasion near PIP joint

EXAM:
RIGHT INDEX FINGER 2+V

[finger ap]
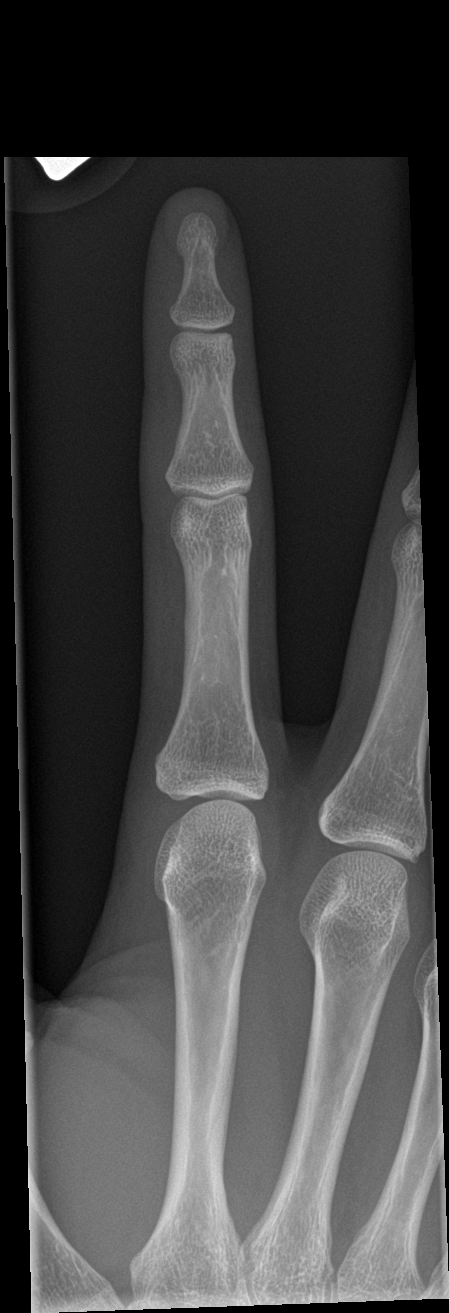

[finger obl]
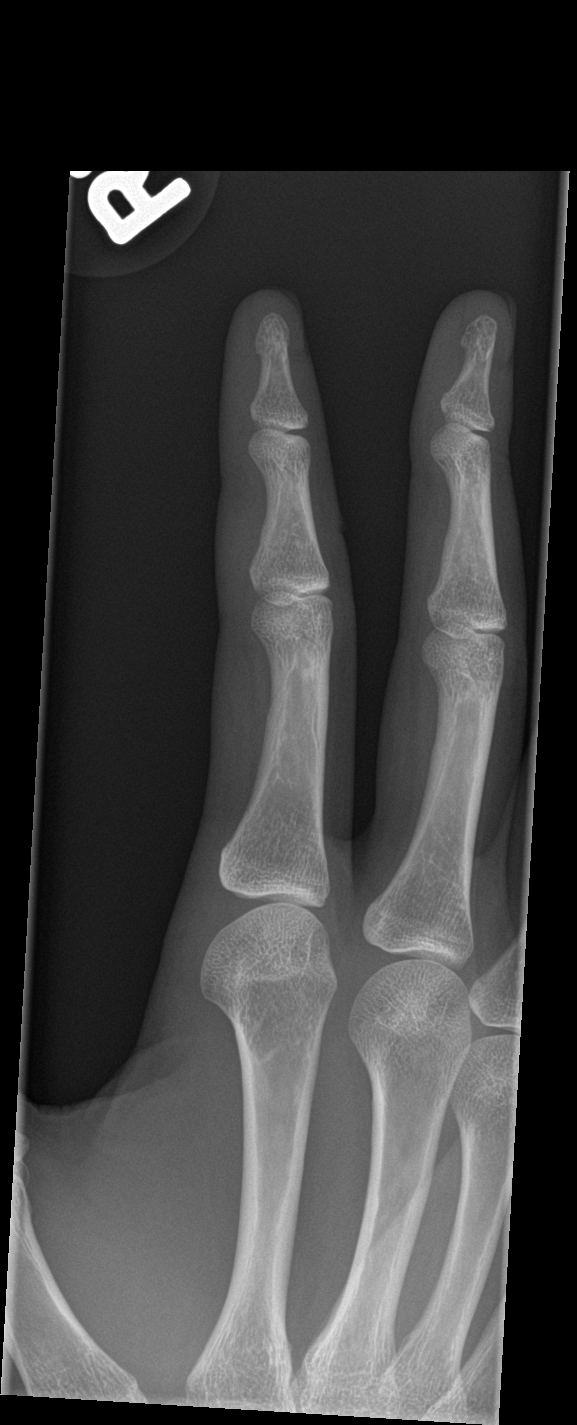

[finger lat]
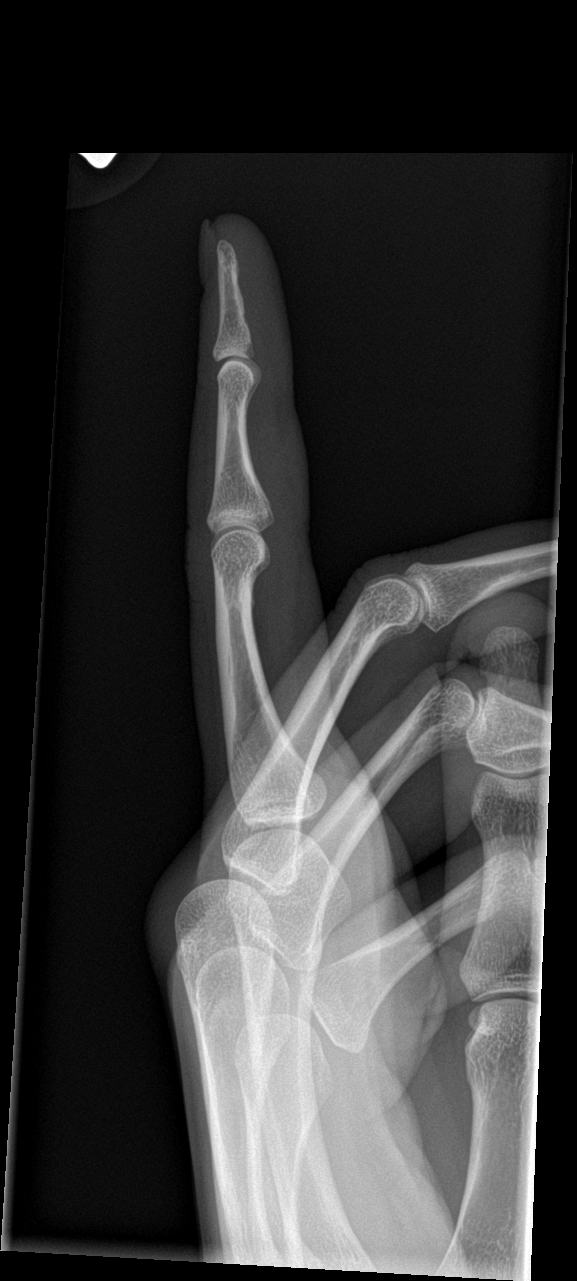

[3 of 3 positions shown; findings below may reference images not displayed]

FINDINGS: Mild diffuse soft tissue swelling greatest at PIP joint.

Osseous mineralization normal.

Joint spaces preserved.

No fracture, dislocation, or bone destruction.
IMPRESSION: No acute osseous abnormalities.

## 2019-02-10 NOTE — Telephone Encounter (Signed)
LVM to inform patient. Provided names of providers who are accepting new patients.

## 2019-02-10 NOTE — Telephone Encounter (Signed)
Provider is not accepting new patients at this time.

## 2019-02-10 NOTE — Telephone Encounter (Signed)
Copied from [place] [id number]. Topic: Appointment Scheduling - Scheduling Inquiry for Clinic >> Feb 08, 2019  9:29 AM Pauline Good wrote: Reason for CRM: pt would like a NP appt with Dr Janett Billow Copland. Please call to advise pt
